# Patient Record
Sex: Female | Born: 1985 | Race: White | Hispanic: No | Marital: Married | Smoking: Never smoker
Health system: Southern US, Community
[De-identification: ages and names within clinical notes are randomized; demographics above are authoritative.]

## PROBLEM LIST (undated history)

## (undated) DIAGNOSIS — F32A Depression, unspecified: Secondary | ICD-10-CM

## (undated) DIAGNOSIS — O039 Complete or unspecified spontaneous abortion without complication: Secondary | ICD-10-CM

## (undated) DIAGNOSIS — E282 Polycystic ovarian syndrome: Secondary | ICD-10-CM

## (undated) DIAGNOSIS — F419 Anxiety disorder, unspecified: Secondary | ICD-10-CM

## (undated) DIAGNOSIS — G43909 Migraine, unspecified, not intractable, without status migrainosus: Secondary | ICD-10-CM

## (undated) DIAGNOSIS — Z8742 Personal history of other diseases of the female genital tract: Secondary | ICD-10-CM

## (undated) DIAGNOSIS — D649 Anemia, unspecified: Secondary | ICD-10-CM

## (undated) DIAGNOSIS — Z6841 Body Mass Index (BMI) 40.0 and over, adult: Secondary | ICD-10-CM

## (undated) HISTORY — DX: Migraine, unspecified, not intractable, without status migrainosus: G43.909

## (undated) HISTORY — PX: OTHER SURGICAL HISTORY: SHX169

## (undated) HISTORY — DX: Anxiety disorder, unspecified: F41.9

## (undated) HISTORY — DX: Complete or unspecified spontaneous abortion without complication: O03.9

## (undated) HISTORY — DX: Anemia, unspecified: D64.9

## (undated) HISTORY — DX: Polycystic ovarian syndrome: E28.2

## (undated) HISTORY — DX: Depression, unspecified: F32.A

## (undated) HISTORY — PX: WISDOM TOOTH EXTRACTION: SHX21

## (undated) HISTORY — DX: Body Mass Index (BMI) 40.0 and over, adult: Z684

---

## 2011-03-06 ENCOUNTER — Ambulatory Visit (INDEPENDENT_AMBULATORY_CARE_PROVIDER_SITE_OTHER): Payer: BC Managed Care – PPO

## 2011-03-06 DIAGNOSIS — J019 Acute sinusitis, unspecified: Secondary | ICD-10-CM

## 2011-03-06 DIAGNOSIS — J309 Allergic rhinitis, unspecified: Secondary | ICD-10-CM

## 2014-08-04 DIAGNOSIS — E282 Polycystic ovarian syndrome: Secondary | ICD-10-CM | POA: Insufficient documentation

## 2016-04-18 LAB — OB RESULTS CONSOLE ABO/RH: RH TYPE: NEGATIVE

## 2017-02-27 LAB — OB RESULTS CONSOLE RPR: RPR: NONREACTIVE

## 2017-02-27 LAB — OB RESULTS CONSOLE ANTIBODY SCREEN: ANTIBODY SCREEN: NEGATIVE

## 2017-02-27 LAB — OB RESULTS CONSOLE HIV ANTIBODY (ROUTINE TESTING): HIV: NONREACTIVE

## 2017-04-23 LAB — OB RESULTS CONSOLE GBS: GBS: POSITIVE

## 2017-05-10 ENCOUNTER — Other Ambulatory Visit: Payer: Self-pay | Admitting: Obstetrics and Gynecology

## 2017-05-10 ENCOUNTER — Encounter (HOSPITAL_COMMUNITY): Payer: Self-pay

## 2017-05-10 NOTE — Patient Instructions (Signed)
Mary Bradford  05/10/2017   Your procedure is scheduled on:  05/12/2017  [facility] at 0945 AM.  Pick up the phone at the desk and dial [phone]  Call this number if you have problems the morning of surgery:[phone]  Remember:   Do not eat food:(After Midnight) Desps de medianoche.  Do not drink clear liquids: (After Midnight) Desps de medianoche.  Take these medicines the morning of surgery with A SIP OF WATER: none   Do not wear jewelry, make-up or nail polish.  Do not wear lotions, powders, or perfumes. Do not wear deodorant.  Do not shave 48 hours prior to surgery.  Do not bring valuables to the hospital.  [facility] is not   responsible for any belongings or valuables brought to the hospital.  Contacts, dentures or bridgework may not be worn into surgery.  Leave suitcase in the car. After surgery it may be brought to your room.  For patients admitted to the hospital, checkout time is 11:00 AM the day of              discharge.    N/A   Please read over the following fact sheets that you were given:   Surgical Site Infection Prevention

## 2017-05-11 ENCOUNTER — Encounter (HOSPITAL_COMMUNITY)
Admission: RE | Admit: 2017-05-11 | Discharge: 2017-05-11 | Disposition: A | Discharge status: Home / Self Care | Payer: BLUE CROSS/BLUE SHIELD | Source: Ambulatory Visit | Attending: Obstetrics and Gynecology | Admitting: Obstetrics and Gynecology

## 2017-05-11 DIAGNOSIS — O321XX0 Maternal care for breech presentation, not applicable or unspecified: Principal | ICD-10-CM | POA: Diagnosis present

## 2017-05-11 DIAGNOSIS — Z6791 Unspecified blood type, Rh negative: Secondary | ICD-10-CM

## 2017-05-11 DIAGNOSIS — O99824 Streptococcus B carrier state complicating childbirth: Secondary | ICD-10-CM | POA: Diagnosis present

## 2017-05-11 DIAGNOSIS — Z3A39 39 weeks gestation of pregnancy: Secondary | ICD-10-CM

## 2017-05-11 HISTORY — DX: Personal history of other diseases of the female genital tract: Z87.42

## 2017-05-11 LAB — CBC
HCT: 34.1 % — ABNORMAL LOW (ref 36.0–46.0)
Hemoglobin: 11.4 g/dL — ABNORMAL LOW (ref 12.0–15.0)
MCH: 27.7 pg (ref 26.0–34.0)
MCHC: 33.4 g/dL (ref 30.0–36.0)
MCV: 82.8 fL (ref 78.0–100.0)
Platelets: 266 10*3/uL (ref 150–400)
RBC: 4.12 MIL/uL (ref 3.87–5.11)
RDW: 14.7 % (ref 11.5–15.5)
WBC: 12.1 10*3/uL — AB (ref 4.0–10.5)

## 2017-05-12 ENCOUNTER — Encounter (HOSPITAL_COMMUNITY): Payer: Self-pay | Admitting: Anesthesiology

## 2017-05-12 ENCOUNTER — Inpatient Hospital Stay (HOSPITAL_COMMUNITY)
Admission: RE | Admit: 2017-05-12 | Discharge: 2017-05-14 | DRG: 788 | Disposition: A | Discharge status: Home / Self Care | Payer: BLUE CROSS/BLUE SHIELD | Source: Ambulatory Visit | Attending: Obstetrics and Gynecology | Admitting: Obstetrics and Gynecology

## 2017-05-12 ENCOUNTER — Inpatient Hospital Stay (HOSPITAL_COMMUNITY): Payer: BLUE CROSS/BLUE SHIELD | Admitting: Anesthesiology

## 2017-05-12 ENCOUNTER — Other Ambulatory Visit: Payer: Self-pay

## 2017-05-12 ENCOUNTER — Encounter (HOSPITAL_COMMUNITY)
Admission: RE | Disposition: A | Discharge status: Home / Self Care | Payer: Self-pay | Source: Ambulatory Visit | Attending: Obstetrics and Gynecology

## 2017-05-12 DIAGNOSIS — O321XX0 Maternal care for breech presentation, not applicable or unspecified: Secondary | ICD-10-CM | POA: Diagnosis present

## 2017-05-12 DIAGNOSIS — Z3A39 39 weeks gestation of pregnancy: Secondary | ICD-10-CM | POA: Diagnosis not present

## 2017-05-12 DIAGNOSIS — Z98891 History of uterine scar from previous surgery: Secondary | ICD-10-CM

## 2017-05-12 DIAGNOSIS — O99824 Streptococcus B carrier state complicating childbirth: Secondary | ICD-10-CM | POA: Diagnosis present

## 2017-05-12 DIAGNOSIS — Z6791 Unspecified blood type, Rh negative: Secondary | ICD-10-CM | POA: Diagnosis not present

## 2017-05-12 LAB — HEPATITIS B SURFACE ANTIGEN: HEP B S AG: NEGATIVE

## 2017-05-12 LAB — RPR: RPR Ser Ql: NONREACTIVE

## 2017-05-12 LAB — TYPE AND SCREEN
ABO/RH(D): B NEG
Antibody Screen: POSITIVE

## 2017-05-12 SURGERY — Surgical Case
Anesthesia: Spinal | Site: Abdomen | Wound class: Clean Contaminated

## 2017-05-12 MED ORDER — DIBUCAINE 1 % RE OINT
1.0000 "application " | TOPICAL_OINTMENT | RECTAL | Status: DC | PRN
  Filled 2017-05-12: qty 28

## 2017-05-12 MED ORDER — FENTANYL CITRATE (PF) 100 MCG/2ML IJ SOLN
INTRAMUSCULAR | Status: AC
  Filled 2017-05-12: qty 2

## 2017-05-12 MED ORDER — OXYTOCIN 10 UNIT/ML IJ SOLN
INTRAVENOUS | Status: DC | PRN
  Administered 2017-05-12: 40 [IU] via INTRAVENOUS

## 2017-05-12 MED ORDER — PHENYLEPHRINE 8 MG IN D5W 100 ML (0.08MG/ML) PREMIX OPTIME
INJECTION | INTRAVENOUS | Status: DC | PRN
  Administered 2017-05-12: 30 ug/min via INTRAVENOUS

## 2017-05-12 MED ORDER — DIPHENHYDRAMINE HCL 25 MG PO CAPS
25.0000 mg | ORAL_CAPSULE | Freq: Four times a day (QID) | ORAL | Status: DC | PRN
  Filled 2017-05-12: qty 1

## 2017-05-12 MED ORDER — MORPHINE SULFATE (PF) 0.5 MG/ML IJ SOLN
INTRAMUSCULAR | Status: DC | PRN
  Administered 2017-05-12: .2 mg via INTRATHECAL

## 2017-05-12 MED ORDER — LACTATED RINGERS IV SOLN
INTRAVENOUS | Status: DC
  Administered 2017-05-12 (×2): via INTRAVENOUS

## 2017-05-12 MED ORDER — OXYCODONE HCL 5 MG PO TABS: 10.0000 mg | ORAL_TABLET | ORAL | Status: DC | PRN

## 2017-05-12 MED ORDER — PRENATAL MULTIVITAMIN CH
1.0000 | ORAL_TABLET | Freq: Every day | ORAL | Status: DC
  Administered 2017-05-13 – 2017-05-14 (×2): 1 via ORAL
  Filled 2017-05-12 (×4): qty 1

## 2017-05-12 MED ORDER — FENTANYL CITRATE (PF) 100 MCG/2ML IJ SOLN
INTRAMUSCULAR | Status: DC | PRN
  Administered 2017-05-12: 20 ug via INTRATHECAL

## 2017-05-12 MED ORDER — NALOXONE HCL 4 MG/10ML IJ SOLN
1.0000 ug/kg/h | INTRAVENOUS | Status: DC | PRN
  Filled 2017-05-12: qty 5

## 2017-05-12 MED ORDER — SCOPOLAMINE 1 MG/3DAYS TD PT72
MEDICATED_PATCH | TRANSDERMAL | Status: AC
  Filled 2017-05-12: qty 1

## 2017-05-12 MED ORDER — NALBUPHINE HCL 10 MG/ML IJ SOLN: 5.0000 mg | Freq: Once | INTRAMUSCULAR | Status: AC | PRN

## 2017-05-12 MED ORDER — NALBUPHINE HCL 10 MG/ML IJ SOLN: 5.0000 mg | INTRAMUSCULAR | Status: DC | PRN

## 2017-05-12 MED ORDER — GENTAMICIN SULFATE 40 MG/ML IJ SOLN
INTRAVENOUS | Status: DC
  Filled 2017-05-12 (×2): qty 11

## 2017-05-12 MED ORDER — SIMETHICONE 80 MG PO CHEW
80.0000 mg | CHEWABLE_TABLET | Freq: Three times a day (TID) | ORAL | Status: DC
  Administered 2017-05-12 – 2017-05-14 (×4): 80 mg via ORAL
  Filled 2017-05-12 (×9): qty 1

## 2017-05-12 MED ORDER — ONDANSETRON HCL 4 MG/2ML IJ SOLN
INTRAMUSCULAR | Status: AC
  Filled 2017-05-12: qty 2

## 2017-05-12 MED ORDER — DIPHENHYDRAMINE HCL 50 MG/ML IJ SOLN: 12.5000 mg | INTRAMUSCULAR | Status: DC | PRN

## 2017-05-12 MED ORDER — ONDANSETRON HCL 4 MG/2ML IJ SOLN
INTRAMUSCULAR | Status: DC | PRN
  Administered 2017-05-12: 4 mg via INTRAVENOUS

## 2017-05-12 MED ORDER — DEXAMETHASONE SODIUM PHOSPHATE 10 MG/ML IJ SOLN
INTRAMUSCULAR | Status: AC
  Filled 2017-05-12: qty 1

## 2017-05-12 MED ORDER — COCONUT OIL OIL
1.0000 "application " | TOPICAL_OIL | Status: DC | PRN
  Filled 2017-05-12: qty 120

## 2017-05-12 MED ORDER — MENTHOL 3 MG MT LOZG
1.0000 | LOZENGE | OROMUCOSAL | Status: DC | PRN
  Filled 2017-05-12: qty 9

## 2017-05-12 MED ORDER — KETOROLAC TROMETHAMINE 30 MG/ML IJ SOLN: 30.0000 mg | Freq: Four times a day (QID) | INTRAMUSCULAR | Status: AC | PRN

## 2017-05-12 MED ORDER — NALBUPHINE HCL 10 MG/ML IJ SOLN
INTRAMUSCULAR | Status: AC
  Filled 2017-05-12: qty 1

## 2017-05-12 MED ORDER — SODIUM CHLORIDE 0.9 % IR SOLN
Status: DC | PRN
  Administered 2017-05-12: 1000 mL

## 2017-05-12 MED ORDER — NALOXONE HCL 0.4 MG/ML IJ SOLN: 0.4000 mg | INTRAMUSCULAR | Status: DC | PRN

## 2017-05-12 MED ORDER — MEPERIDINE HCL 25 MG/ML IJ SOLN: 6.2500 mg | INTRAMUSCULAR | Status: DC | PRN

## 2017-05-12 MED ORDER — SIMETHICONE 80 MG PO CHEW
80.0000 mg | CHEWABLE_TABLET | ORAL | Status: DC | PRN
Start: 2017-05-12 — End: 2017-05-14
  Filled 2017-05-12: qty 1

## 2017-05-12 MED ORDER — KETOROLAC TROMETHAMINE 30 MG/ML IJ SOLN
INTRAMUSCULAR | Status: AC
  Administered 2017-05-12: 30 mg via INTRAMUSCULAR
  Filled 2017-05-12: qty 1

## 2017-05-12 MED ORDER — MORPHINE SULFATE (PF) 0.5 MG/ML IJ SOLN
INTRAMUSCULAR | Status: AC
  Filled 2017-05-12: qty 10

## 2017-05-12 MED ORDER — SENNOSIDES-DOCUSATE SODIUM 8.6-50 MG PO TABS
2.0000 | ORAL_TABLET | ORAL | Status: DC
  Administered 2017-05-12 – 2017-05-13 (×2): 2 via ORAL
  Filled 2017-05-12 (×4): qty 2

## 2017-05-12 MED ORDER — OXYTOCIN 10 UNIT/ML IJ SOLN
INTRAMUSCULAR | Status: AC
  Filled 2017-05-12: qty 4

## 2017-05-12 MED ORDER — LACTATED RINGERS IV SOLN
INTRAVENOUS | Status: DC
  Administered 2017-05-12: 21:00:00 via INTRAVENOUS

## 2017-05-12 MED ORDER — OXYCODONE HCL 5 MG PO TABS
5.0000 mg | ORAL_TABLET | ORAL | Status: DC | PRN
  Administered 2017-05-13: 5 mg via ORAL
  Filled 2017-05-12: qty 1

## 2017-05-12 MED ORDER — FENTANYL CITRATE (PF) 100 MCG/2ML IJ SOLN: 25.0000 ug | INTRAMUSCULAR | Status: DC | PRN

## 2017-05-12 MED ORDER — LACTATED RINGERS IV SOLN
INTRAVENOUS | Status: DC
  Administered 2017-05-12: 12:00:00 via INTRAVENOUS

## 2017-05-12 MED ORDER — DEXTROSE 5 % IV SOLN
INTRAVENOUS | Status: DC | PRN
  Administered 2017-05-12: 100 mL via INTRAVENOUS

## 2017-05-12 MED ORDER — BUPIVACAINE IN DEXTROSE 0.75-8.25 % IT SOLN
INTRATHECAL | Status: DC | PRN
  Administered 2017-05-12: 1.5 mL via INTRATHECAL

## 2017-05-12 MED ORDER — WITCH HAZEL-GLYCERIN EX PADS: 1.0000 "application " | MEDICATED_PAD | CUTANEOUS | Status: DC | PRN

## 2017-05-12 MED ORDER — ACETAMINOPHEN 325 MG PO TABS
650.0000 mg | ORAL_TABLET | ORAL | Status: DC | PRN
  Administered 2017-05-13 (×2): 650 mg via ORAL
  Filled 2017-05-12 (×2): qty 2

## 2017-05-12 MED ORDER — METOCLOPRAMIDE HCL 5 MG/ML IJ SOLN: 10.0000 mg | Freq: Once | INTRAMUSCULAR | Status: DC | PRN

## 2017-05-12 MED ORDER — DEXAMETHASONE SODIUM PHOSPHATE 10 MG/ML IJ SOLN
INTRAMUSCULAR | Status: DC | PRN
  Administered 2017-05-12: 10 mg via INTRAVENOUS

## 2017-05-12 MED ORDER — SODIUM CHLORIDE 0.9% FLUSH: 3.0000 mL | INTRAVENOUS | Status: DC | PRN

## 2017-05-12 MED ORDER — DIPHENHYDRAMINE HCL 25 MG PO CAPS
25.0000 mg | ORAL_CAPSULE | ORAL | Status: DC | PRN
  Filled 2017-05-12: qty 1

## 2017-05-12 MED ORDER — IBUPROFEN 600 MG PO TABS
600.0000 mg | ORAL_TABLET | Freq: Four times a day (QID) | ORAL | Status: DC
  Administered 2017-05-12 – 2017-05-14 (×7): 600 mg via ORAL
  Filled 2017-05-12 (×7): qty 1

## 2017-05-12 MED ORDER — SOD CITRATE-CITRIC ACID 500-334 MG/5ML PO SOLN
30.0000 mL | Freq: Once | ORAL | Status: AC
  Administered 2017-05-12: 30 mL via ORAL
  Filled 2017-05-12: qty 15

## 2017-05-12 MED ORDER — ONDANSETRON HCL 4 MG/2ML IJ SOLN: 4.0000 mg | Freq: Three times a day (TID) | INTRAMUSCULAR | Status: DC | PRN

## 2017-05-12 MED ORDER — TETANUS-DIPHTH-ACELL PERTUSSIS 5-2.5-18.5 LF-MCG/0.5 IM SUSP
0.5000 mL | Freq: Once | INTRAMUSCULAR | Status: DC
  Filled 2017-05-12: qty 0.5

## 2017-05-12 MED ORDER — SCOPOLAMINE 1 MG/3DAYS TD PT72
1.0000 | MEDICATED_PATCH | Freq: Once | TRANSDERMAL | Status: AC
  Administered 2017-05-12: 1.5 mg via TRANSDERMAL
  Administered 2017-05-12: 1 via TRANSDERMAL
  Filled 2017-05-12: qty 1

## 2017-05-12 MED ORDER — OXYTOCIN 40 UNITS IN LACTATED RINGERS INFUSION - SIMPLE MED: 2.5000 [IU]/h | INTRAVENOUS | Status: AC

## 2017-05-12 MED ORDER — ZOLPIDEM TARTRATE 5 MG PO TABS: 5.0000 mg | ORAL_TABLET | Freq: Every evening | ORAL | Status: DC | PRN

## 2017-05-12 MED ORDER — SIMETHICONE 80 MG PO CHEW
80.0000 mg | CHEWABLE_TABLET | ORAL | Status: DC
  Administered 2017-05-12 – 2017-05-13 (×2): 80 mg via ORAL
  Filled 2017-05-12 (×4): qty 1

## 2017-05-12 MED ORDER — LACTATED RINGERS IV SOLN
INTRAVENOUS | Status: DC | PRN
  Administered 2017-05-12: 13:00:00 via INTRAVENOUS

## 2017-05-12 MED ORDER — NALBUPHINE HCL 10 MG/ML IJ SOLN
5.0000 mg | Freq: Once | INTRAMUSCULAR | Status: AC | PRN
  Administered 2017-05-12: 5 mg via SUBCUTANEOUS

## 2017-05-12 SURGICAL SUPPLY — 34 items
BENZOIN TINCTURE PRP APPL 2/3 (GAUZE/BANDAGES/DRESSINGS) ×2 IMPLANT
CHLORAPREP W/TINT 26ML (MISCELLANEOUS) ×2 IMPLANT
CLAMP CORD UMBIL (MISCELLANEOUS) IMPLANT
CLOTH BEACON ORANGE TIMEOUT ST (SAFETY) ×2 IMPLANT
DRSG OPSITE POSTOP 4X10 (GAUZE/BANDAGES/DRESSINGS) ×2 IMPLANT
ELECT REM PT RETURN 9FT ADLT (ELECTROSURGICAL) ×2
ELECTRODE REM PT RTRN 9FT ADLT (ELECTROSURGICAL) ×1 IMPLANT
EXTRACTOR VACUUM M CUP 4 TUBE (SUCTIONS) IMPLANT
GLOVE BIO SURGEON STRL SZ7.5 (GLOVE) ×2 IMPLANT
GLOVE BIOGEL PI IND STRL 7.0 (GLOVE) ×1 IMPLANT
GLOVE BIOGEL PI IND STRL 7.5 (GLOVE) ×1 IMPLANT
GLOVE BIOGEL PI INDICATOR 7.0 (GLOVE) ×1
GLOVE BIOGEL PI INDICATOR 7.5 (GLOVE) ×1
GOWN STRL REUS W/TWL LRG LVL3 (GOWN DISPOSABLE) ×4 IMPLANT
HEMOSTAT SURGICEL 2X14 (HEMOSTASIS) ×2 IMPLANT
KIT ABG SYR 3ML LUER SLIP (SYRINGE) IMPLANT
NEEDLE HYPO 25X5/8 SAFETYGLIDE (NEEDLE) IMPLANT
NS IRRIG 1000ML POUR BTL (IV SOLUTION) ×2 IMPLANT
PACK C SECTION WH (CUSTOM PROCEDURE TRAY) ×2 IMPLANT
PAD OB MATERNITY 4.3X12.25 (PERSONAL CARE ITEMS) ×2 IMPLANT
PENCIL BUTTON HOLSTER BLD 10FT (ELECTRODE) ×2 IMPLANT
PENCIL SMOKE EVAC W/HOLSTER (ELECTROSURGICAL) ×2 IMPLANT
RTRCTR C-SECT PINK 25CM LRG (MISCELLANEOUS) ×2 IMPLANT
STRIP CLOSURE SKIN 1/2X4 (GAUZE/BANDAGES/DRESSINGS) ×2 IMPLANT
SUT CHROMIC 2 0 CT 1 (SUTURE) ×2 IMPLANT
SUT MNCRL AB 3-0 PS2 27 (SUTURE) ×2 IMPLANT
SUT PLAIN 2 0 XLH (SUTURE) ×2 IMPLANT
SUT VIC AB 0 CT1 36 (SUTURE) ×2 IMPLANT
SUT VIC AB 0 CTX 36 (SUTURE) ×3
SUT VIC AB 0 CTX36XBRD ANBCTRL (SUTURE) ×3 IMPLANT
SUT VIC AB 2-0 SH 27 (SUTURE) ×2
SUT VIC AB 2-0 SH 27XBRD (SUTURE) ×2 IMPLANT
TOWEL OR 17X24 6PK STRL BLUE (TOWEL DISPOSABLE) ×2 IMPLANT
TRAY FOLEY BAG SILVER LF 14FR (SET/KITS/TRAYS/PACK) ×2 IMPLANT

## 2017-05-12 NOTE — Lactation Note (Signed)
This note was copied from a baby's chart. Lactation Consultation Note  Patient Name: Mary Bradford WUJWJ'XToday's Date: 05/12/2017 Reason for consult: Initial assessment;Primapara;1st time breastfeeding;Term  7 hours old female who is being exclusively BF by his mother, she's a P1. Nurse called because she was concerned about baby latching on and transferring. Mom has short shaft nipples and cone shaped breasts, but she stated that she noticed changes in her breast during the pregnancy, the got larger and the areola darker. Taught mom how to hand express, but only got traces of colostrum, less than a drop.   Baby had difficulty latching on; did some suck training and noticed baby's tongue has some degree of restriction. Tried a # 20 NS and baby was able to grasp the breast easily after a few attempts, but no swallows were heard. Baby was still feeding when exiting the room, asked parents to watch for colostrum on NS once baby is done.   RN will set up a DEBP for mom and she'll start pumping after feedings at least 8 times/day. Encouraged mom to keep putting baby at the breast STS on feeding cues 8-12 times/day and use NS if baby has difficulty latching. Reviewed BF brochure, BF resources and feeding diary, mom is aware of LC services and will call PRN.  Maternal Data Formula Feeding for Exclusion: No Has patient been taught Hand Expression?: Yes Does the patient have breastfeeding experience prior to this delivery?: No  Feeding Feeding Type: Breast Fed  LATCH Score Latch: Repeated attempts needed to sustain latch, nipple held in mouth throughout feeding, stimulation needed to elicit sucking reflex.  Audible Swallowing: None  Type of Nipple: Everted at rest and after stimulation  Comfort (Breast/Nipple): Soft / non-tender  Hold (Positioning): Assistance needed to correctly position infant at breast and maintain latch.  LATCH Score: 6  Interventions Interventions: Breast feeding  basics reviewed;Assisted with latch;Skin to skin;Breast massage;Breast compression;Hand express;Adjust position;Support pillows;Position options  Lactation Tools Discussed/Used Tools: Nipple Shields Nipple shield size: 20 WIC Program: No   Consult Status Consult Status: Follow-up Date: 05/13/17 Follow-up type: In-patient    Akio Hudnall Venetia ConstableS Markelle Asaro 05/12/2017, 8:58 PM

## 2017-05-12 NOTE — Anesthesia Preprocedure Evaluation (Addendum)
Anesthesia Evaluation  Patient identified by MRN, date of birth, ID band Patient awake    Reviewed: Allergy & Precautions, NPO status , Patient's Chart, lab work & pertinent test results  Airway Mallampati: III  TM Distance: >3 FB Neck ROM: Full    Dental no notable dental hx. 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- 2+ years ago, asymptomatic currently   Abdominal (+) + obese,   Peds  Hematology  (+) anemia ,   Anesthesia Other Findings   Reproductive/Obstetrics Transverse lie IVF pregnancy                           Anesthesia Physical Anesthesia Plan  ASA: III  Anesthesia Plan: Spinal   Post-op Pain Management:    Induction:   PONV Risk Score and Plan:   Airway Management Planned: Natural Airway  Additional Equipment:   Intra-op Plan:   Post-operative Plan:   Informed Consent: I have reviewed the patients History and Physical, chart, labs and discussed the procedure including the risks, benefits and alternatives for the proposed anesthesia with the patient or authorized representative who has indicated his/her understanding and acceptance.   Dental advisory given  Plan Discussed with: CRNA, Anesthesiologist and Surgeon  Anesthesia Plan Comments:         Anesthesia Quick Evaluation

## 2017-05-12 NOTE — Transfer of Care (Signed)
Immediate Anesthesia Transfer of Care Note  Patient: Mary Bradford  Procedure(s) Performed: CESAREAN SECTION (N/A Abdomen)  Patient Location: PACU  Anesthesia Type:Spinal  Level of Consciousness: awake, alert  and oriented  Airway & Oxygen Therapy: Patient Spontanous Breathing  Post-op Assessment: Report given to RN and Post -op Vital signs reviewed and stable  Post vital signs: Reviewed and stable  Last Vitals:  Vitals:   05/12/17 1103  Pulse: 100  Temp: 36.7 C    Last Pain:  Vitals:   05/12/17 1103  TempSrc: Oral         Complications: No apparent anesthesia complications

## 2017-05-12 NOTE — Op Note (Signed)
Cesarean Section Procedure Note  Indications: P0 at 39wks admitted for scheduled primary c-section for malpresentation.  Pre-operative Diagnosis: 1.39wks 2.breech presenation/malpresentation   Post-operative Diagnosis: 1.39wks 2.breech presenation/malpresentation  Procedure: PRIMARY LOW TRANSVERSE CESAREAN SECTION  Surgeon: Osborn Cohooberts, Jontae Sonier, MD    Assistants: Genice Rougeracey Tucker, RNFA  Anesthesia: Spinal  Anesthesiologist: Mal AmabileFoster, Michael, MD   Procedure Details  The patient was taken to the operating room secondary to malpresentation after the risks, benefits, complications, treatment options, and expected outcomes were discussed with the patient.  The patient concurred with the proposed plan, giving informed consent which was signed and witnessed. The patient was taken to Operating Room One, identified as Mary Bradford and the procedure verified as C-Section Delivery. A Time Out was held and the above information confirmed.  After induction of anesthesia by obtaining a spinal, the patient was prepped and draped in the usual sterile manner. A Pfannenstiel skin incision was made and carried down through the subcutaneous tissue to the underlying layer of fascia.  The fascia was incised bilaterally and extended transversely bilaterally with the Mayo scissors. Kocher clamps were placed on the inferior aspect of the fascial incision and the underlying rectus muscle was separated from the fascia. The same was done on the superior aspect of the fascial incision.  The peritoneum was identified, entered bluntly and extended manually.  An Alexis self-retaining retractor was placed.  The utero-vesical peritoneal reflection was incised transversely and the bladder flap was bluntly freed from the lower uterine segment. A low transverse uterine incision was made with the scalpel and extended bilaterally with the bandage scissors.  The infant was delivered in vertex position without difficulty.  After the  umbilical cord was clamped and cut, the infant was handed to the awaiting pediatricians.  Cord blood was obtained for evaluation.  The placenta was removed intact and appeared to be within normal limits. The uterus was cleared of all clots and debris. The uterine incision was closed with running interlocking sutures of 0 Vicryl and a second imbricating layer was performed as well.   Bilateral tubes and ovaries appeared to be within normal limits.  One cm paratubal clear cyst removed with bovie. Good hemostasis was noted.  Copious irrigation was performed until clear.  The peritoneum was repaired with 2-0 chromic via a running suture.  The fascia was reapproximated with a running suture of 0 Vicryl. The subcutaneous tissue was reapproximated with 3 interrupted sutures of 2-0 plain.  The skin was reapproximated with a subcuticular suture of 3-0 monocryl.  Steristrips were applied.  Instrument, sponge, and needle counts were correct prior to abdominal closure and at the conclusion of the case.  The patient was awaiting transfer to the recovery room in good condition.  Findings: Live female infant with Apgars 8 at one minute and 9 at five minutes.  Normal appearing bilateral ovaries and fallopian tubes were noted.  Estimated Blood Loss:  400 ml         Drains: foley to gravity 30 cc         Total IV Fluids: 2000 ml         Specimens to Pathology: none         Complications:  None; patient tolerated the procedure well.         Disposition: PACU - hemodynamically stable.         Condition: stable  Attending Attestation: I performed the procedure.

## 2017-05-12 NOTE — Anesthesia Procedure Notes (Signed)
Spinal  Patient location during procedure: OR Start time: 05/12/2017 12:51 PM Staffing Anesthesiologist: Mal AmabileFoster, Adar Rase, MD Performed: anesthesiologist  Preanesthetic Checklist Completed: patient identified, site marked, surgical consent, pre-op evaluation, timeout performed, IV checked, risks and benefits discussed and monitors and equipment checked Spinal Block Patient position: sitting Prep: site prepped and draped and DuraPrep Patient monitoring: heart rate, cardiac monitor, continuous pulse ox and blood pressure Approach: midline Location: L3-4 Injection technique: single-shot Needle Needle type: Pencan  Needle gauge: 24 G Needle length: 9 cm Needle insertion depth: 7 cm Assessment Sensory level: T4 Additional Notes Patient tolerated procedure well. Adequate sensory level.

## 2017-05-12 NOTE — Anesthesia Postprocedure Evaluation (Signed)
Anesthesia Post Note  Patient: Mary Bradford  Procedure(s) Performed: CESAREAN SECTION (N/A Abdomen)     Patient location during evaluation: PACU Anesthesia Type: Spinal Level of consciousness: oriented and awake and alert Pain management: pain level controlled Vital Signs Assessment: post-procedure vital signs reviewed and stable Respiratory status: spontaneous breathing, respiratory function stable and nonlabored ventilation Cardiovascular status: blood pressure returned to baseline and stable Postop Assessment: no headache, no backache, no apparent nausea or vomiting, spinal receding and patient able to bend at knees Anesthetic complications: no    Last Vitals:  Vitals:   05/12/17 1515 05/12/17 1530  BP:  106/69  Pulse: 79 65  Resp: (!) 23   Temp:  37.3 C  SpO2: 98%     Last Pain:  Vitals:   05/12/17 1530  TempSrc: Oral   Pain Goal:                 Quianna Avery A.

## 2017-05-12 NOTE — Interval H&P Note (Signed)
History and Physical Interval Note:  05/12/2017 12:17 PM  Mary Bradford  has presented today for surgery, with the diagnosis of Transverse Lie  The various methods of treatment have been discussed with the patient and family. After consideration of risks, benefits and other options for treatment, the patient has consented to  Procedure(s) with comments: CESAREAN SECTION, ULTRASOUND BEFORE CESAREAN TO CONFIRM PRESENTATION (N/A) - RNFA Requested ULTRASOUND BEFORE CESAREAN TO CONFIRM PRESENTATION as a surgical intervention .  The patient's history has been reviewed, patient examined, no change in status, stable for surgery.  I have reviewed the patient's chart and labs.  Questions were answered to the patient's satisfaction.     Purcell NailsAngela Y Marylin Lathon

## 2017-05-12 NOTE — H&P (Signed)
Mary Bradford is a 32 y.o. female presenting for c-section due to malpresentation.  Pt denies lof, vb, labor ctxs and reports +FM. 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  OB History    Gravida Para Term Preterm AB Living   2       1     SAB TAB Ectopic Multiple Live Births   1             Past Medical History:  Diagnosis Date  . History of PCOS   . Newborn product of in vitro fertilization (IVF) pregnancy    Past Surgical History:  Procedure Laterality Date  . egg  retrieval    . WISDOM TOOTH EXTRACTION     Family History: family history includes Heart disease in her maternal grandmother; Hypertension in her father and mother; Irritable bowel syndrome in her sister; Kidney Stones in her father and mother; Kidney disease in her sister; Leukemia in her maternal uncle and paternal grandfather; Migraines in her father and sister; Non-Hodgkin's lymphoma in her paternal grandfather. Social History:  reports that  has never smoked. she has never used smokeless tobacco. She reports that she does not drink alcohol or use drugs.     Maternal Diabetes: No Genetic Screening: Declined Maternal Ultrasounds/Referrals: Normal Fetal Ultrasounds or other Referrals:  None Maternal Substance Abuse:  No Significant Maternal Medications:  None Significant Maternal Lab Results:  Lab values include: Group B Strep positive, Rh negative Other Comments:  None  ROS  Non-contributory  History   Pulse 100, temperature 98.1 F (36.7 C), temperature source Oral. Exam Physical Exam  Lungs CTA CV RRR Abd gravid, NT Ext no calf tenderness, NT +FHT  Prenatal labs: ABO, Rh: --/--/B NEG (02/15 1048) Antibody: POS (02/15 1048) Rubella:  pending RPR: Non Reactive (02/15 1048)  HBsAg:  pending HIV: Non-reactive (12/04 0000)  GBS: Positive (01/28 0000)   Assessment/Plan: P0 at 39 wks being admitted for c-section due to  malpresentation.  Bedside u/s to confirm malpresentation.  R/B/A discussed including but not limited to B/I/I and consent s/w.   Purcell Nailsngela Y Rudine Rieger 05/12/2017, 11:15 AM

## 2017-05-13 LAB — CBC
HCT: 26.7 % — ABNORMAL LOW (ref 36.0–46.0)
Hemoglobin: 9.1 g/dL — ABNORMAL LOW (ref 12.0–15.0)
MCH: 28.1 pg (ref 26.0–34.0)
MCHC: 34.1 g/dL (ref 30.0–36.0)
MCV: 82.4 fL (ref 78.0–100.0)
PLATELETS: 243 10*3/uL (ref 150–400)
RBC: 3.24 MIL/uL — AB (ref 3.87–5.11)
RDW: 14.7 % (ref 11.5–15.5)
WBC: 17.1 10*3/uL — AB (ref 4.0–10.5)

## 2017-05-13 LAB — RUBELLA ANTIBODY, IGM: Rubella IgM: <20 AU/mL (ref 0.0–19.9)

## 2017-05-13 NOTE — Addendum Note (Signed)
Addendum  created 05/13/17 0755 by Rica Recordsickelton, Cagney Degrace, CRNA   Sign clinical note

## 2017-05-13 NOTE — Progress Notes (Signed)
Subjective: Postpartum Day 1: Cesarean Delivery breech Patient reports + flatus and no problems voiding.    Objective: Vital signs in last 24 hours: Temp:  [97.6 F (36.4 C)-99.1 F (37.3 C)] 98.2 F (36.8 C) (02/17 0235) Pulse Rate:  [65-106] 72 (02/17 0235) Resp:  [15-24] 18 (02/17 0235) BP: (102-137)/(47-120) 102/55 (02/17 0235) SpO2:  [81 %-100 %] 97 % (02/17 0400) Weight:  [127.6 kg (281 lb 6.4 oz)] 127.6 kg (281 lb 6.4 oz) (02/16 1145)  Physical Exam:  General: alert, cooperative and no distress Lochia: appropriate Uterine Fundus: firm Incision: honey comb intact, no drainage DVT Evaluation: No evidence of DVT seen on physical exam. Negative Homan's sign.  Recent Labs    05/11/17 1048  HGB 11.4*  HCT 34.1*    Assessment/Plan: Status post Cesarean section. Doing well postoperatively.  Continue current care.  Plan on discharge tomorrow.  Mary Bradford 05/13/2017, 5:45 AM

## 2017-05-13 NOTE — Anesthesia Postprocedure Evaluation (Signed)
Anesthesia Post Note  Patient: Mary Bradford  Procedure(s) Performed: CESAREAN SECTION (N/A Abdomen)     Patient location during evaluation: Mother Baby Anesthesia Type: Spinal Level of consciousness: oriented and awake and alert Pain management: pain level controlled Vital Signs Assessment: post-procedure vital signs reviewed and stable Respiratory status: spontaneous breathing, respiratory function stable and patient connected to nasal cannula oxygen Cardiovascular status: blood pressure returned to baseline and stable Postop Assessment: no headache, no backache, no apparent nausea or vomiting and patient able to bend at knees Anesthetic complications: no    Last Vitals:  Vitals:   05/13/17 0400 05/13/17 0639  BP:  (!) 92/52  Pulse:  73  Resp:  18  Temp:  36.8 C  SpO2: 97% 97%    Last Pain:  Vitals:   05/13/17 0315  TempSrc:   PainSc: 4    Pain Goal:                 Rica RecordsICKELTON,Terease Marcotte

## 2017-05-14 LAB — RUBELLA SCREEN: Rubella: 1.3 index (ref >0.99)

## 2017-05-14 MED ORDER — IBUPROFEN 600 MG PO TABS: 600.0000 mg | ORAL_TABLET | Freq: Four times a day (QID) | ORAL | 0 refills | Status: DC

## 2017-05-14 MED ORDER — OXYCODONE HCL 5 MG PO TABS: 5.0000 mg | ORAL_TABLET | ORAL | 0 refills | Status: DC | PRN

## 2017-05-14 NOTE — Discharge Summary (Signed)
    OB Discharge Summary     Patient Name: Mary Bradford DOB: [birth year] MRN: [id number]  Date of admission: 05/12/2017 Delivering MD: Osborn CohoOBERTS, ANGELA   Date of discharge: 05/14/2017  Admitting diagnosis: Transverse Lie Intrauterine pregnancy: 1541w0d     Secondary diagnosis:  Active Problems:   S/P primary low transverse C-section  Additional problems: None     Discharge diagnosis: Term Pregnancy Delivered                                                                                                Post partum procedures:None  Augmentation: None  Complications: None  Hospital course:  Sceduled C/S   32 y.o. yo G2P1011 at 4241w0d was admitted to the hospital 05/12/2017 for scheduled cesarean section with the following indication:Malpresentation.  Membrane Rupture Time/Date: 1:16 PM ,05/12/2017   Patient delivered a Viable infant.05/12/2017  Details of operation can be found in separate operative note.  Pateint had an uncomplicated postpartum course.  She is ambulating, tolerating a regular diet, passing flatus, and urinating well. Patient is discharged home in stable condition on  05/14/17         Physical exam  Vitals:   05/13/17 1116 05/13/17 1430 05/13/17 1834 05/14/17 0523  BP: 136/74 121/68 123/60 107/60  Pulse: 71 98 91 81  Resp: 18 16 18 18   Temp: 97.7 F (36.5 C) 98.6 F (37 C) 97.6 F (36.4 C) 98.2 F (36.8 C)  TempSrc: Oral Axillary Axillary Oral  SpO2:  98%    Weight:      Height:       General: alert, cooperative and no distress Lochia: appropriate Uterine Fundus: firm Incision: Dressing is clean, dry, and intact DVT Evaluation: No evidence of DVT seen on physical exam. Negative Homan's sign. Labs: Lab Results  Component Value Date   WBC 17.1 (H) 05/13/2017   HGB 9.1 (L) 05/13/2017   HCT 26.7 (L) 05/13/2017   MCV 82.4 05/13/2017   PLT 243 05/13/2017   No flowsheet data found.  Discharge instruction: per After Visit Summary and "Baby and Me  Booklet".  After visit meds:  PNV, Ibuprofen, percocet  Diet: routine diet  Activity: Advance as tolerated. Pelvic rest for 6 weeks.   Outpatient follow up:6 weeks Follow up Appt:No future appointments. Follow up Visit:No Follow-up on file.  Postpartum contraception: Undecided  Newborn Data: Live born female  Birth Weight: 8 lb 11.9 oz (3965 g) APGAR: 8, 9  Newborn Delivery   Birth date/time:  05/12/2017 13:18:00 Delivery type:  C-Section, Low Transverse C-section categorization:  Primary     Baby Feeding: Breast Disposition:home with mother   05/14/2017 Kenney HousemanNancy Jean Elijiah Mickley, CNM

## 2017-05-15 LAB — TYPE AND SCREEN
ABO/RH(D): B NEG
Antibody Screen: POSITIVE
UNIT DIVISION: 0
UNIT DIVISION: 0
UNIT DIVISION: 0
Unit division: 0

## 2017-05-15 LAB — BPAM RBC
BLOOD PRODUCT EXPIRATION DATE: 201903202359
BLOOD PRODUCT EXPIRATION DATE: 201903222359
Blood Product Expiration Date: 201903132359
Blood Product Expiration Date: 201903192359
UNIT TYPE AND RH: 1700
UNIT TYPE AND RH: 1700
UNIT TYPE AND RH: 9500
Unit Type and Rh: 9500

## 2019-03-02 ENCOUNTER — Encounter: Payer: Self-pay | Admitting: Nurse Practitioner

## 2019-03-07 ENCOUNTER — Encounter: Payer: Self-pay | Admitting: Nurse Practitioner

## 2019-03-07 ENCOUNTER — Other Ambulatory Visit: Payer: Self-pay

## 2019-03-07 ENCOUNTER — Ambulatory Visit (INDEPENDENT_AMBULATORY_CARE_PROVIDER_SITE_OTHER): Payer: Self-pay | Admitting: Nurse Practitioner

## 2019-03-07 DIAGNOSIS — G43009 Migraine without aura, not intractable, without status migrainosus: Secondary | ICD-10-CM

## 2019-03-07 DIAGNOSIS — E669 Obesity, unspecified: Secondary | ICD-10-CM | POA: Insufficient documentation

## 2019-03-07 DIAGNOSIS — Z6841 Body Mass Index (BMI) 40.0 and over, adult: Secondary | ICD-10-CM | POA: Insufficient documentation

## 2019-03-07 DIAGNOSIS — G43909 Migraine, unspecified, not intractable, without status migrainosus: Secondary | ICD-10-CM | POA: Insufficient documentation

## 2019-03-07 DIAGNOSIS — Z23 Encounter for immunization: Secondary | ICD-10-CM

## 2019-03-07 NOTE — Assessment & Plan Note (Signed)
Recommend  focus on healthy diet choices and regular physical activity (30 minutes 5 days a week).  Focus on small goals at a time.  

## 2019-03-07 NOTE — Assessment & Plan Note (Signed)
Chronic, stable with no medications at this time.  Continue to monitor and adjust regimen as needed based on symptoms and frequency.

## 2019-03-07 NOTE — Assessment & Plan Note (Signed)
Recommend focus on small goals with diet changes and regular exercise.  Plan on follow-up in 4 weeks and will obtain baseline labs.

## 2019-03-07 NOTE — Patient Instructions (Signed)

## 2019-03-07 NOTE — Progress Notes (Signed)
New Patient Office Visit  Subjective:  Patient ID: Mary Bradford, female    DOB: 10/15/1985  Age: 33 y.o. MRN: [id number]  CC:  Chief Complaint  Patient presents with  . Establish Care    HPI Mary Bradford presents for new patient visit to establish care.  Introduced to Designer, jewellery role and practice setting.  All questions answered.  Has history of IVF and has two-year-old son at home.  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  Brings form in today to fill out to continue virtual classes if they open schools back up in January.    No current chronic illness or concerns.  Has history of migraines, which she reports minimal issues with.  Was also told she had PCOS at one time, but reports that her fertility providers did not find this.  Past Medical History:  Diagnosis Date  . History of PCOS   . Migraines   . Miscarriage    2018  . Newborn product of in vitro fertilization (IVF) pregnancy     Past Surgical History:  Procedure Laterality Date  . CESAREAN SECTION N/A 05/12/2017   Procedure: CESAREAN SECTION;  Surgeon: Everett Graff, MD;  Location: [facility];  Service: Obstetrics;  Laterality: N/A;  RNFA Requested OPERATIVE ULTRASOUND BEFORE CESAREAN TO CONFIRM PRESENTATION  . egg  retrieval    . WISDOM TOOTH EXTRACTION      Family History  Problem Relation Age of Onset  . Kidney Stones Mother   . Hypertension Mother   . Hypertension Father   . Kidney Stones Father   . Migraines Father   . Diabetes Father   . Irritable bowel syndrome Sister   . Migraines Sister   . Kidney disease Sister   . Leukemia Sister   . Heart disease Maternal Grandmother   . Diabetes Maternal Grandmother   . Non-Hodgkin's lymphoma Paternal Grandfather   . Leukemia Paternal Grandfather   . Hodgkin's lymphoma Paternal Grandfather   . Leukemia Maternal Uncle   . Diabetes Maternal Grandfather   . Dementia Maternal Grandfather       Social History   Socioeconomic History  . Marital status: Married    Spouse name: Not on file  . Number of children: 1  . Years of education: Not on file  . Highest education level: Not on file  Occupational History  . Occupation: Pharmacist, hospital    Comment: Pension scheme manager  Tobacco Use  . Smoking status: Never Smoker  . Smokeless tobacco: Never Used  Substance and Sexual Activity  . Alcohol use: No  . Drug use: No  . Sexual activity: Yes  Other Topics Concern  . Not on file  Social History Narrative  . Not on file   Social Determinants of Health   Financial Resource Strain: Low Risk   . Difficulty of Paying Living Expenses: Not very hard  Food Insecurity: No Food Insecurity  . Worried About Charity fundraiser in the Last Year: Never true  . Ran Out of Food in the Last Year: Never true  Transportation Needs: No Transportation Needs  . Lack of Transportation (Medical): No  . Lack of Transportation (Non-Medical): No  Physical Activity: Inactive  . Days of Exercise per Week: 0 days  . Minutes of Exercise per Session: 0 min  Stress: No Stress Concern Present  . Feeling of Stress : Only a little  Social Connections: Somewhat Isolated  . Frequency of Communication with Friends and  Family: Three times a week  . Frequency of Social Gatherings with Friends and Family: Three times a week  . Attends Religious Services: Never  . Active Member of Clubs or Organizations: No  . Attends BankerClub or Organization Meetings: Never  . Marital Status: Married  Catering managerntimate Partner Violence: Not At Risk  . Fear of Current or Ex-Partner: No  . Emotionally Abused: No  . Physically Abused: No  . Sexually Abused: No    ROS Review of Systems  Constitutional: Negative for activity change, appetite change, diaphoresis, fatigue and fever.  Respiratory: Negative.   Cardiovascular: Negative.   Gastrointestinal: Negative.   Endocrine: Negative.   Neurological: Negative.    Psychiatric/Behavioral: Negative.     Objective:   Today's Vitals: BP 136/74 (BP Location: Left Arm)   Pulse 88   Temp 97.7 F (36.5 C) (Oral)   Ht 5' 7.5" (1.715 m)   Wt 268 lb (121.6 kg)   LMP 02/22/2019   SpO2 97%   BMI 41.36 kg/m   Physical Exam Vitals and nursing note reviewed.  Constitutional:      General: She is awake. She is not in acute distress.    Appearance: She is well-developed. She is morbidly obese. She is not ill-appearing.  HENT:     Head: Normocephalic.     Right Ear: Hearing normal.     Left Ear: Hearing normal.  Eyes:     General: Lids are normal.        Right eye: No discharge.        Left eye: No discharge.     Conjunctiva/sclera: Conjunctivae normal.     Pupils: Pupils are equal, round, and reactive to light.  Neck:     Thyroid: No thyromegaly.     Vascular: No carotid bruit.  Cardiovascular:     Rate and Rhythm: Normal rate and regular rhythm.     Heart sounds: Normal heart sounds. No murmur. No gallop.   Pulmonary:     Effort: Pulmonary effort is normal. No accessory muscle usage or respiratory distress.     Breath sounds: Normal breath sounds.  Abdominal:     General: Bowel sounds are normal.     Palpations: Abdomen is soft.  Musculoskeletal:     Cervical back: Normal range of motion and neck supple.     Right lower leg: No edema.     Left lower leg: No edema.  Skin:    General: Skin is warm and dry.  Neurological:     Mental Status: She is alert and oriented to person, place, and time.  Psychiatric:        Attention and Perception: Attention normal.        Mood and Affect: Mood normal.        Behavior: Behavior normal. Behavior is cooperative.        Thought Content: Thought content normal.        Judgment: Judgment normal.     Assessment & Plan:   Problem List Items Addressed This Visit      Cardiovascular and Mediastinum   Migraines    Chronic, stable with no medications at this time.  Continue to monitor and adjust  regimen as needed based on symptoms and frequency.        Other   BMI 40.0-44.9, adult (HCC)    Recommend  focus on healthy diet choices and regular physical activity (30 minutes 5 days a week).  Focus on small goals at a time.  Morbid obesity (HCC) - Primary    Recommend focus on small goals with diet changes and regular exercise.  Plan on follow-up in 4 weeks and will obtain baseline labs.       Other Visit Diagnoses    Need for influenza vaccination       Relevant Orders   Flu Vaccine QUAD 36+ mos PF IM (Fluarix & Fluzone Quad PF) (Completed)      Outpatient Encounter Medications as of 03/07/2019  Medication Sig  . [DISCONTINUED] acetaminophen (TYLENOL) 500 MG tablet Take 1,000 mg by mouth every 8 (eight) hours as needed for headache.  . [DISCONTINUED] calcium carbonate (TUMS - DOSED IN MG ELEMENTAL CALCIUM) 500 MG chewable tablet Chew 2 tablets by mouth daily as needed for indigestion or heartburn.  . [DISCONTINUED] Docusate Sodium (COLACE PO) Take 1-2 tablets by mouth daily as needed (1-2 tablets depending on constipation).  . [DISCONTINUED] ibuprofen (ADVIL,MOTRIN) 600 MG tablet Take 1 tablet (600 mg total) by mouth every 6 (six) hours.  . [DISCONTINUED] oxyCODONE (OXY IR/ROXICODONE) 5 MG immediate release tablet Take 1 tablet (5 mg total) by mouth every 4 (four) hours as needed (pain scale 4-7).  . [DISCONTINUED] Prenatal Vit-Fe Fumarate-FA (PRENATAL MULTIVITAMIN) TABS tablet Take 1 tablet by mouth daily at 12 noon.   No facility-administered encounter medications on file as of 03/07/2019.    Follow-up: Return in about 4 weeks (around 04/04/2019) for Follow-up.   Marjie Skiff, NP

## 2019-03-24 ENCOUNTER — Ambulatory Visit: Payer: Self-pay | Admitting: Nurse Practitioner

## 2019-04-04 ENCOUNTER — Ambulatory Visit (INDEPENDENT_AMBULATORY_CARE_PROVIDER_SITE_OTHER): Payer: Self-pay | Admitting: Nurse Practitioner

## 2019-04-04 ENCOUNTER — Other Ambulatory Visit: Payer: Self-pay

## 2019-04-04 ENCOUNTER — Encounter: Payer: Self-pay | Admitting: Nurse Practitioner

## 2019-04-04 DIAGNOSIS — F320 Major depressive disorder, single episode, mild: Secondary | ICD-10-CM | POA: Insufficient documentation

## 2019-04-04 DIAGNOSIS — Z806 Family history of leukemia: Secondary | ICD-10-CM

## 2019-04-04 DIAGNOSIS — F4321 Adjustment disorder with depressed mood: Secondary | ICD-10-CM

## 2019-04-04 NOTE — Patient Instructions (Signed)
Managing Loss, Adult People experience loss in many different ways throughout their lives. Events such as moving, changing jobs, and losing friends can create a sense of loss. The loss may be as serious as a major health change, divorce, death of a pet, or death of a loved one. All of these types of loss are likely to create a physical and emotional reaction known as grief. Grief is the result of a major change or an absence of something or someone that you count on. Grief is a normal reaction to loss. A variety of factors can affect your grieving experience, including:  The nature of your loss.  Your relationship to what or whom you lost.  Your understanding of grief and how to manage it.  Your support system. How to manage lifestyle changes Keep to your normal routine as much as possible.  If you have trouble focusing or doing normal activities, it is acceptable to take some time away from your normal routine.  Spend time with friends and loved ones.  Eat a healthy diet, get plenty of sleep, and rest when you feel tired. How to recognize changes  The way that you deal with your grief will affect your ability to function as you normally do. When grieving, you may experience these changes:  Numbness, shock, sadness, anxiety, anger, denial, and guilt.  Thoughts about death.  Unexpected crying.  A physical sensation of emptiness in your stomach.  Problems sleeping and eating.  Tiredness (fatigue).  Loss of interest in normal activities.  Dreaming about or imagining seeing the person who died.  A need to remember what or whom you lost.  Difficulty thinking about anything other than your loss for a period of time.  Relief. If you have been expecting the loss for a while, you may feel a sense of relief when it happens. Follow these instructions at home:  Activity Express your feelings in healthy ways, such as:  Talking with others about your loss. It may be helpful to find  others who have had a similar loss, such as a support group.  Writing down your feelings in a journal.  Doing physical activities to release stress and emotional energy.  Doing creative activities like painting, sculpting, or playing or listening to music.  Practicing resilience. This is the ability to recover and adjust after facing challenges. Reading some resources that encourage resilience may help you to learn ways to practice those behaviors. General instructions  Be patient with yourself and others. Allow the grieving process to happen, and remember that grieving takes time. ? It is likely that you may never feel completely done with some grief. You may find a way to move on while still cherishing memories and feelings about your loss. ? Accepting your loss is a process. It can take months or longer to adjust.  Keep all follow-up visits as told by your health care provider. This is important. Where to find support To get support for managing loss:  Ask your health care provider for help and recommendations, such as grief counseling or therapy.  Think about joining a support group for people who are managing a loss. Where to find more information You can find more information about managing loss from:  American Society of Clinical Oncology: www.cancer.net  American Psychological Association: www.apa.org Contact a health care provider if:  Your grief is extreme and keeps getting worse.  You have ongoing grief that does not improve.  Your body shows symptoms of grief, such   as illness.  You feel depressed, anxious, or lonely. Get help right away if:  You have thoughts about hurting yourself or others. If you ever feel like you may hurt yourself or others, or have thoughts about taking your own life, get help right away. You can go to your nearest emergency department or call:  Your local emergency services (911 in the [place]  A suicide crisis helpline, such as the  National Suicide Prevention Lifeline at 1-800-273-8255. This is open 24 hours a day. Summary  Grief is the result of a major change or an absence of someone or something that you count on. Grief is a normal reaction to loss.  The depth of grief and the period of recovery depend on the type of loss and your ability to adjust to the change and process your feelings.  Processing grief requires patience and a willingness to accept your feelings and talk about your loss with people who are supportive.  It is important to find resources that work for you and to realize that people experience grief differently. There is not one grieving process that works for everyone in the same way.  Be aware that when grief becomes extreme, it can lead to more severe issues like isolation, depression, anxiety, or suicidal thoughts. Talk with your health care provider if you have any of these issues. This information is not intended to replace advice given to you by your health care provider. Make sure you discuss any questions you have with your health care provider. Document Revised: 05/17/2018 Document Reviewed: 07/27/2016 Elsevier Patient Education  2020 Elsevier Inc.  

## 2019-04-04 NOTE — Assessment & Plan Note (Signed)
Referral placed to [facility] for genetic counseling and evaluation.

## 2019-04-04 NOTE — Progress Notes (Signed)
There were no vitals taken for this visit.   Subjective:    Patient ID: Mary Bradford, female    DOB: 1986/03/09, 35 y.o.   MRN: [id number]  HPI: Mary Bradford is a 34 y.o. female  Chief Complaint  Patient presents with  . Depression    recent death in the family  . Genetic Evaluation    . This visit was completed via FaceTime due to the restrictions of the COVID-19 pandemic. All issues as above were discussed and addressed. Physical exam was done as above through visual confirmation on FaceTime. If it was felt that the patient should be evaluated in the office, they were directed there. The patient verbally consented to this visit. . Location of the patient: home . Location of the provider: home . Those involved with this call:  . Provider: Marnee Guarneri, DNP . CMA: Yvonna Alanis, CMA . Front Desk/Registration: Don Perking  . Time spent on call: 15 minutes with patient face to face via video conference. More than 50% of this time was spent in counseling and coordination of care. 10 minutes total spent in review of patient's record and preparation of their chart.  . I verified patient identity using two factors (patient name and date of birth). Patient consents verbally to being seen via telemedicine visit today.   DEPRESSION She reports her sister passed away three days before Christmas (she was 30), went to hospital and WBC was 300,000.  Told her she had AML and was provided treatment at [facility].  Had side effects from this treatment and passed away.  Her sister had two small children, who parents are taking care of now and patient is helping them with this.  Her sister was separated from her husband.    Does feel in past she had some postpartum depression after her son was born.  Has never taken medications for mood.  We discussed at length the grieving process.  At this time she would prefer not to take medication.  Did try to reach out to therapist, but  reports no one has gotten back to her or some are not taking patients.  Educated her on [facility].   Mood status: stable Satisfied with current treatment?: yes Symptom severity: moderate  Duration of current treatment : chronic Side effects: no Psychotherapy/counseling: has attempted to obtain, but has had no success. 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 Anhedonia: no Significant weight loss or gain: no Insomnia: none Fatigue: yes Feelings of worthlessness or guilt: no Impaired concentration/indecisiveness: no , able to teach without issue and focus Suicidal ideations: no Hopelessness: no Crying spells: yes Depression screen [facility] 2/9 04/04/2019 03/07/2019  Decreased Interest 2 0  Down, Depressed, Hopeless 3 0  PHQ - 2 Score 5 0  Altered sleeping 1 -  Tired, decreased energy 3 -  Change in appetite 3 -  Feeling bad or failure about yourself  0 -  Trouble concentrating 1 -  Moving slowly or fidgety/restless 0 -  Suicidal thoughts 0 -  PHQ-9 Score 13 -  Difficult doing work/chores Somewhat difficult -   GAD 7 : Generalized Anxiety Score 04/04/2019  Nervous, Anxious, on Edge 1  Control/stop worrying 2  Worry too much - different things 3  Trouble relaxing 2  Restless 0  Easily annoyed or irritable 1  Afraid -  awful might happen 1  Total GAD 7 Score 10  Anxiety Difficulty Very difficult   GENETIC EVALUATION: Reports her sister's AML was a rare type which was reported to her by [facility] provider + her mom's brother passed away in 30's from AML.  It was recommended by oncology at [facility] that patient obtain genetic counselling and evaluation with work-up.  Relevant past medical, surgical, family and social history reviewed and updated as indicated. Interim medical history since our last visit reviewed. Allergies and  medications reviewed and updated.  Review of Systems  Constitutional: Negative for activity change, appetite change, diaphoresis, fatigue and fever.  Respiratory: Negative for cough, chest tightness and shortness of breath.   Cardiovascular: Negative for chest pain, palpitations and leg swelling.  Endocrine: Negative for polyuria.  Psychiatric/Behavioral: Negative for decreased concentration, self-injury, sleep disturbance and suicidal ideas. The patient is nervous/anxious.     Per HPI unless specifically indicated above     Objective:    There were no vitals taken for this visit.  Wt Readings from Last 3 Encounters:  03/07/19 268 lb (121.6 kg)  05/12/17 281 lb 6.4 oz (127.6 kg)  05/10/17 273 lb (123.8 kg)    Physical Exam Vitals and nursing note reviewed.  Constitutional:      General: She is awake. She is not in acute distress.    Appearance: She is well-developed. She is not ill-appearing.  HENT:     Head: Normocephalic.     Right Ear: Hearing normal.     Left Ear: Hearing normal.  Eyes:     General: Lids are normal.        Right eye: No discharge.        Left eye: No discharge.     Conjunctiva/sclera: Conjunctivae normal.  Pulmonary:     Effort: Pulmonary effort is normal. No accessory muscle usage or respiratory distress.  Musculoskeletal:     Cervical back: Normal range of motion.  Neurological:     Mental Status: She is alert and oriented to person, place, and time.  Psychiatric:        Attention and Perception: Attention normal.        Mood and Affect: Mood normal.        Behavior: Behavior normal. Behavior is cooperative.        Thought Content: Thought content normal.        Judgment: Judgment normal.     Results for orders placed or performed during the hospital encounter of 05/12/17  Hepatitis B surface antigen  Result Value Ref Range   Hepatitis B Surface Ag Negative Negative  Rubella antibody, IgM  Result Value Ref Range   Rubella IgM <20.0 0.0 -  19.9 AU/mL  CBC  Result Value Ref Range   WBC 17.1 (H) 4.0 - 10.5 K/uL   RBC 3.24 (L) 3.87 - 5.11 MIL/uL   Hemoglobin 9.1 (L) 12.0 - 15.0 g/dL   HCT 06.2 (L) 69.4 - 85.4 %   MCV 82.4 78.0 - 100.0 fL   MCH 28.1 26.0 - 34.0 pg   MCHC 34.1 30.0 - 36.0 g/dL   RDW 62.7 03.5 - 00.9 %   Platelets 243 150 - 400 K/uL  Rubella screen  Result Value Ref Range   Rubella 1.30 Immune >0.99 index  Type and screen All Cardiac and thoracic surgeries, spinal fusions, myomectomies, craniotomies, colon & liver resections, total joint revisions, same day c-section with placenta previa or accreta.  Result Value Ref Range   ABO/RH(D)  B NEG    Antibody Screen POS    Sample Expiration 05/12/2017       Assessment & Plan:   Problem List Items Addressed This Visit      Other   Grief - Primary    Lengthy discussion with patient, at this time she wishes not to start any medication.  Denies SI/HI.  Urgent referral to therapy placed.  Recommend she contact [facility] to discuss grief support group and provided her with number to contact.  Will follow-up with her in 4 weeks or sooner if worsening symptoms.       Relevant Orders   Ambulatory referral to Psychology   Family history of AML (acute myeloid leukemia)    Referral placed to [facility] for genetic counseling and evaluation.      Relevant Orders   Ambulatory referral to Genetics      I discussed the assessment and treatment plan with the patient. The patient was provided an opportunity to ask questions and all were answered. The patient agreed with the plan and demonstrated an understanding of the instructions.   The patient was advised to call back or seek an in-person evaluation if the symptoms worsen or if the condition fails to improve as anticipated.   I provided 15 minutes of time during this encounter.  Follow up plan: Return in about 4 weeks (around 05/02/2019) for Grief.

## 2019-04-04 NOTE — Assessment & Plan Note (Signed)
Lengthy discussion with patient, at this time she wishes not to start any medication.  Denies SI/HI.  Urgent referral to therapy placed.  Recommend she contact [facility] to discuss grief support group and provided her with number to contact.  Will follow-up with her in 4 weeks or sooner if worsening symptoms.

## 2019-04-08 ENCOUNTER — Encounter: Payer: Self-pay | Admitting: Nurse Practitioner

## 2019-04-08 NOTE — Progress Notes (Signed)
Lvm to make 4 week appoitment, sent letter.

## 2019-06-04 ENCOUNTER — Encounter: Payer: Self-pay | Admitting: Nurse Practitioner

## 2019-06-04 ENCOUNTER — Encounter: Payer: BC Managed Care – PPO | Admitting: Nurse Practitioner

## 2019-06-04 ENCOUNTER — Telehealth (INDEPENDENT_AMBULATORY_CARE_PROVIDER_SITE_OTHER): Payer: BC Managed Care – PPO | Admitting: Nurse Practitioner

## 2019-06-04 DIAGNOSIS — Z806 Family history of leukemia: Secondary | ICD-10-CM

## 2019-06-04 DIAGNOSIS — F320 Major depressive disorder, single episode, mild: Secondary | ICD-10-CM

## 2019-06-04 MED ORDER — SERTRALINE HCL 50 MG PO TABS: 50.0000 mg | ORAL_TABLET | Freq: Every day | ORAL | 3 refills | Status: DC

## 2019-06-04 NOTE — Assessment & Plan Note (Signed)
Will look into referral to genetic counseling and provided her with number for [facility] clinic.

## 2019-06-04 NOTE — Progress Notes (Signed)
There were no vitals taken for this visit.   Subjective:    Patient ID: Mary Bradford, female    DOB: [birth year], 34 y.o.   MRN: [id number]  HPI: Mary Bradford is a 34 y.o. female  Chief Complaint  Patient presents with  . Follow-up    . This visit was completed via MyChart due to the restrictions of the COVID-19 pandemic. All issues as above were discussed and addressed. Physical exam was done as above through visual confirmation on MyChart. If it was felt that the patient should be evaluated in the office, they were directed there. The patient verbally consented to this visit. . Location of the patient: home . Location of the provider: work . Those involved with this call:  . Provider: Aura Dials, DNP . CMA: Wilhemena Durie, CMA . Front Desk/Registration: Adela Ports  . Time spent on call: 15 minutes with patient face to face via video conference. More than 50% of this time was spent in counseling and coordination of care. 10 minutes total spent in review of patient's record and preparation of their chart.  . I verified patient identity using two factors (patient name and date of birth). Patient consents verbally to being seen via telemedicine visit today.   DEPRESSION Her sister passed away three days before Christmas 2020 (she was 30), went to hospital and WBC was 300,000.  Told her she had AML and was provided treatment at [facility].  Had side effects from this treatment and passed away.  Her sister had two small children, who parents are taking care of now and patient is helping them with this.  She has been going to therapy with [facility] in [place].  Was referred for genetic testing at [facility] last visit, but has not heard from them.  Does feel in past she had some postpartum depression after her son was born.  Has never taken medications for mood.  We discussed at length the grieving process.  At last visit she did not want to start medication, but at  this time she does report wishes to try medication.  Educated her on current guidelines and use of SSRI, she is interested in starting this.  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 Anhedonia: no Significant weight loss or gain: no Insomnia: none Fatigue: yes Feelings of worthlessness or guilt: no Impaired concentration/indecisiveness: yes Suicidal ideations: no Hopelessness: no Crying spells: yes Depression screen [facility] 2/9 06/04/2019 04/04/2019 03/07/2019  Decreased Interest 2 2 0  Down, Depressed, Hopeless 2 3 0  PHQ - 2 Score 4 5 0  Altered sleeping 3 1 -  Tired, decreased energy 3 3 -  Change in appetite 1 3 -  Feeling bad or failure about yourself  0 0 -  Trouble concentrating 1 1 -  Moving slowly or fidgety/restless 0 0 -  Suicidal thoughts 0 0 -  PHQ-9 Score 12 13 -  Difficult doing work/chores Somewhat difficult Somewhat difficult -   GAD 7 : Generalized Anxiety Score 06/04/2019 04/04/2019  Nervous, Anxious, on Edge 2 1  Control/stop worrying 3 2  Worry too much - different things 3 3  Trouble relaxing 3 2  Restless 2 0  Easily annoyed or irritable 3 1  Afraid - awful might happen 1 1  Total GAD 7 Score 17 10  Anxiety Difficulty Somewhat difficult Very difficult    Relevant past medical, surgical, family and social history reviewed and updated as indicated. Interim medical history since our last visit reviewed. Allergies and medications reviewed and updated.  Review of Systems  Constitutional: Negative for activity change, appetite change, diaphoresis, fatigue and fever.  Respiratory: Negative for cough, chest tightness and shortness of breath.   Cardiovascular: Negative for chest pain, palpitations and leg  swelling.  Gastrointestinal: Negative.   Neurological: Negative.   Psychiatric/Behavioral: Positive for decreased concentration and sleep disturbance. Negative for self-injury and suicidal ideas. The patient is nervous/anxious.     Per HPI unless specifically indicated above     Objective:    There were no vitals taken for this visit.  Wt Readings from Last 3 Encounters:  03/07/19 268 lb (121.6 kg)  05/12/17 281 lb 6.4 oz (127.6 kg)  05/10/17 273 lb (123.8 kg)    Physical Exam Vitals and nursing note reviewed.  Constitutional:      General: She is awake. She is not in acute distress.    Appearance: She is well-developed. She is obese. She is not ill-appearing.  HENT:     Head: Normocephalic.     Right Ear: Hearing normal.     Left Ear: Hearing normal.  Eyes:     General: Lids are normal.        Right eye: No discharge.        Left eye: No discharge.     Conjunctiva/sclera: Conjunctivae normal.  Pulmonary:     Effort: Pulmonary effort is normal. No accessory muscle usage or respiratory distress.  Musculoskeletal:     Cervical back: Normal range of motion.  Neurological:     Mental Status: She is alert and oriented to person, place, and time.  Psychiatric:        Attention and Perception: Attention normal.        Mood and Affect: Mood normal.        Behavior: Behavior normal. Behavior is cooperative.        Thought Content: Thought content normal.        Judgment: Judgment normal.     Results for orders placed or performed during the hospital encounter of 05/12/17  Hepatitis B surface antigen  Result Value Ref Range   Hepatitis B Surface Ag Negative Negative  Rubella antibody, IgM  Result Value Ref Range   Rubella IgM <20.0 0.0 - 19.9 AU/mL  CBC  Result Value Ref Range   WBC 17.1 (H) 4.0 - 10.5 K/uL   RBC 3.24 (L) 3.87 - 5.11 MIL/uL   Hemoglobin 9.1 (L) 12.0 - 15.0 g/dL   HCT 26.7 (L) 36.0 - 46.0 %   MCV 82.4 78.0 - 100.0 fL   MCH 28.1 26.0 - 34.0 pg   MCHC  34.1 30.0 - 36.0 g/dL   RDW 14.7 11.5 - 15.5 %   Platelets 243 150 - 400 K/uL  Rubella screen  Result Value Ref Range   Rubella 1.30 Immune >0.99 index  Type and screen All Cardiac and thoracic surgeries, spinal fusions, myomectomies, craniotomies, colon & liver resections, total joint revisions, same day c-section with placenta previa or accreta.  Result Value Ref Range   ABO/RH(D) B NEG    Antibody Screen POS    Sample Expiration 05/12/2017       Assessment & Plan:   Problem List Items Addressed This Visit      Other  Depression, major, single episode, mild (HCC) - Primary    Ongoing with grief related to loss of her sister. PHQ9 = 12 and GAD = 17.  Denies SI/HI.  Does wish to start medication.  Educated her on SSRI family.  Will start Sertraline 50 MG daily, script sent.  She is to continue with her therapist as scheduled.  Return in 4 weeks for follow-up and medication adjustment if needed.      Relevant Medications   sertraline (ZOLOFT) 50 MG tablet   Family history of AML (acute myeloid leukemia)    Will look into referral to genetic counseling and provided her with number for [facility].         I discussed the assessment and treatment plan with the patient. The patient was provided an opportunity to ask questions and all were answered. The patient agreed with the plan and demonstrated an understanding of the instructions.   The patient was advised to call back or seek an in-person evaluation if the symptoms worsen or if the condition fails to improve as anticipated.   I provided 15+ minutes of time during this encounter.  Follow up plan: Return in about 4 weeks (around 07/02/2019) for Mood.

## 2019-06-04 NOTE — Assessment & Plan Note (Signed)
Ongoing with grief related to loss of her sister. PHQ9 = 12 and GAD = 17.  Denies SI/HI.  Does wish to start medication.  Educated her on SSRI family.  Will start Sertraline 50 MG daily, script sent.  She is to continue with her therapist as scheduled.  Return in 4 weeks for follow-up and medication adjustment if needed.

## 2019-06-04 NOTE — Patient Instructions (Signed)
Living With Depression Everyone experiences occasional disappointment, sadness, and loss in their lives. When you are feeling down, blue, or sad for at least 2 weeks in a row, it may mean that you have depression. Depression can affect your thoughts and feelings, relationships, daily activities, and physical health. It is caused by changes in the way your brain functions. If you receive a diagnosis of depression, your health care provider will tell you which type of depression you have and what treatment options are available to you. If you are living with depression, there are ways to help you recover from it and also ways to prevent it from coming back. How to cope with lifestyle changes Coping with stress     Stress is your body's reaction to life changes and events, both good and bad. Stressful situations may include:  Getting married.  The death of a spouse.  Losing a job.  Retiring.  Having a baby. Stress can last just a few hours or it can be ongoing. Stress can play a major role in depression, so it is important to learn both how to cope with stress and how to think about it differently. Talk with your health care provider or a counselor if you would like to learn more about stress reduction. He or she may suggest some stress reduction techniques, such as:  Music therapy. This can include creating music or listening to music. Choose music that you enjoy and that inspires you.  Mindfulness-based meditation. This kind of meditation can be done while sitting or walking. It involves being aware of your normal breaths, rather than trying to control your breathing.  Centering prayer. This is a kind of meditation that involves focusing on a spiritual word or phrase. Choose a word, phrase, or sacred image that is meaningful to you and that brings you peace.  Deep breathing. To do this, expand your stomach and inhale slowly through your nose. Hold your breath for 3-5 seconds, then exhale  slowly, allowing your stomach muscles to relax.  Muscle relaxation. This involves intentionally tensing muscles then relaxing them. Choose a stress reduction technique that fits your lifestyle and personality. Stress reduction techniques take time and practice to develop. Set aside 5-15 minutes a day to do them. Therapists can offer training in these techniques. The training may be covered by some insurance plans. Other things you can do to manage stress include:  Keeping a stress diary. This can help you learn what triggers your stress and ways to control your response.  Understanding what your limits are and saying no to requests or events that lead to a schedule that is too full.  Thinking about how you respond to certain situations. You may not be able to control everything, but you can control how you react.  Adding humor to your life by watching funny films or TV shows.  Making time for activities that help you relax and not feeling guilty about spending your time this way.  Medicines Your health care provider may suggest certain medicines if he or she feels that they will help improve your condition. Avoid using alcohol and other substances that may prevent your medicines from working properly (may interact). It is also important to:  Talk with your pharmacist or health care provider about all the medicines that you take, their possible side effects, and what medicines are safe to take together.  Make it your goal to take part in all treatment decisions (shared decision-making). This includes giving input on   the side effects of medicines. It is best if shared decision-making with your health care provider is part of your total treatment plan. If your health care provider prescribes a medicine, you may not notice the full benefits of it for 4-8 weeks. Most people who are treated for depression need to be on medicine for at least 6-12 months after they feel better. If you are taking  medicines as part of your treatment, do not stop taking medicines without first talking to your health care provider. You may need to have the medicine slowly decreased (tapered) over time to decrease the risk of harmful side effects. Relationships Your health care provider may suggest family therapy along with individual therapy and drug therapy. While there may not be family problems that are causing you to feel depressed, it is still important to make sure your family learns as much as they can about your mental health. Having your family's support can help make your treatment successful. How to recognize changes in your condition Everyone has a different response to treatment for depression. Recovery from major depression happens when you have not had signs of major depression for two months. This may mean that you will start to:  Have more interest in doing activities.  Feel less hopeless than you did 2 months ago.  Have more energy.  Overeat less often, or have better or improving appetite.  Have better concentration. Your health care provider will work with you to decide the next steps in your recovery. It is also important to recognize when your condition is getting worse. Watch for these signs:  Having fatigue or low energy.  Eating too much or too little.  Sleeping too much or too little.  Feeling restless, agitated, or hopeless.  Having trouble concentrating or making decisions.  Having unexplained physical complaints.  Feeling irritable, angry, or aggressive. Get help as soon as you or your family members notice these symptoms coming back. How to get support and help from others How to talk with friends and family members about your condition  Talking to friends and family members about your condition can provide you with one way to get support and guidance. Reach out to trusted friends or family members, explain your symptoms to them, and let them know that you are  working with a health care provider to treat your depression. Financial resources Not all insurance plans cover mental health care, so it is important to check with your insurance carrier. If paying for co-pays or counseling services is a problem, search for a local or county mental health care center. They may be able to offer public mental health care services at low or no cost when you are not able to see a private health care provider. If you are taking medicine for depression, you may be able to get the generic form, which may be less expensive. Some makers of prescription medicines also offer help to patients who cannot afford the medicines they need. Follow these instructions at home:   Get the right amount and quality of sleep.  Cut down on using caffeine, tobacco, alcohol, and other potentially harmful substances.  Try to exercise, such as walking or lifting small weights.  Take over-the-counter and prescription medicines only as told by your health care provider.  Eat a healthy diet that includes plenty of vegetables, fruits, whole grains, low-fat dairy products, and lean protein. Do not eat a lot of foods that are high in solid fats, added sugars, or salt.    Keep all follow-up visits as told by your health care provider. This is important. Contact a health care provider if:  You stop taking your antidepressant medicines, and you have any of these symptoms: ? Nausea. ? Headache. ? Feeling lightheaded. ? Chills and body aches. ? Not being able to sleep (insomnia).  You or your friends and family think your depression is getting worse. Get help right away if:  You have thoughts of hurting yourself or others. If you ever feel like you may hurt yourself or others, or have thoughts about taking your own life, get help right away. You can go to your nearest emergency department or call:  Your local emergency services (911 in the [place]  A suicide crisis helpline, such as the  National Suicide Prevention Lifeline at 1-800-273-8255. This is open 24-hours a day. Summary  If you are living with depression, there are ways to help you recover from it and also ways to prevent it from coming back.  Work with your health care team to create a management plan that includes counseling, stress management techniques, and healthy lifestyle habits. This information is not intended to replace advice given to you by your health care provider. Make sure you discuss any questions you have with your health care provider. Document Revised: 07/05/2018 Document Reviewed: 02/14/2016 Elsevier Patient Education  2020 Elsevier Inc.  

## 2019-07-03 ENCOUNTER — Telehealth (INDEPENDENT_AMBULATORY_CARE_PROVIDER_SITE_OTHER): Payer: BC Managed Care – PPO | Admitting: Nurse Practitioner

## 2019-07-03 ENCOUNTER — Encounter: Payer: Self-pay | Admitting: Nurse Practitioner

## 2019-07-03 DIAGNOSIS — F320 Major depressive disorder, single episode, mild: Secondary | ICD-10-CM | POA: Diagnosis not present

## 2019-07-03 DIAGNOSIS — Z806 Family history of leukemia: Secondary | ICD-10-CM

## 2019-07-03 MED ORDER — SERTRALINE HCL 50 MG PO TABS: 75.0000 mg | ORAL_TABLET | Freq: Every day | ORAL | 3 refills | Status: DC

## 2019-07-03 NOTE — Assessment & Plan Note (Signed)
Will look into referral to genetic counseling and provided her with number for [facility] clinic.

## 2019-07-03 NOTE — Progress Notes (Signed)
There were no vitals taken for this visit.   Subjective:    Patient ID: Mary Bradford, female    DOB: [birth year], 34 y.o.   MRN: [id number]  HPI: Mary Bradford is a 34 y.o. female  Chief Complaint  Patient presents with  . Depression    . This visit was completed via MyChart due to the restrictions of the COVID-19 pandemic. All issues as above were discussed and addressed. Physical exam was done as above through visual confirmation on MyChart. If it was felt that the patient should be evaluated in the office, they were directed there. The patient verbally consented to this visit. . Location of the patient: home . Location of the provider: home . Those involved with this call:  . Provider: Aura Dials, DNP . CMA: Wilhemena Durie, CMA . Front Desk/Registration: Adela Ports  . Time spent on call: 15 minutes with patient face to face via video conference. More than 50% of this time was spent in counseling and coordination of care. 10 minutes total spent in review of patient's record and preparation of their chart.  . I verified patient identity using two factors (patient name and date of birth). Patient consents verbally to being seen via telemedicine visit today.    DEPRESSION Was started on Sertraline 50 MG last visit and reports improvement in mood.  Did have to go through a holiday and her sister's birthday, which were difficult times, but overall seeing improvement in mood.  Is interested in increasing to 75 MG for more benefit. Mood status: improved with medication Satisfied with current treatment?: yes Symptom severity: mild  Duration of current treatment : months Side effects: no Medication compliance: good compliance Psychotherapy/counseling: current Depressed mood: occasional, but improved Anxious mood: no Anhedonia: no Significant weight loss or gain: no Insomnia: yes hard to fall asleep Fatigue: a little bit Feelings of worthlessness or  guilt: no Impaired concentration/indecisiveness: no Suicidal ideations: no Hopelessness: no Crying spells: occasional due to holidays Depression screen [facility] 2/9 07/03/2019 06/04/2019 04/04/2019 03/07/2019  Decreased Interest 0 2 2 0  Down, Depressed, Hopeless 2 2 3  0  PHQ - 2 Score 2 4 5  0  Altered sleeping 1 3 1  -  Tired, decreased energy 1 3 3  -  Change in appetite 1 1 3  -  Feeling bad or failure about yourself  0 0 0 -  Trouble concentrating 1 1 1  -  Moving slowly or fidgety/restless 0 0 0 -  Suicidal thoughts 0 0 0 -  PHQ-9 Score 6 12 13  -  Difficult doing work/chores Somewhat difficult Somewhat difficult Somewhat difficult -   GAD 7 : Generalized Anxiety Score 07/03/2019 06/04/2019 04/04/2019  Nervous, Anxious, on Edge 0 2 1  Control/stop worrying 1 3 2   Worry too much - different things 1 3 3   Trouble relaxing 1 3 2   Restless 0 2 0  Easily annoyed or irritable 0 3 1  Afraid - awful might happen 0 1 1  Total GAD 7 Score 3 17 10   Anxiety Difficulty Somewhat difficult Somewhat difficult Very difficult    Relevant past medical, surgical, family and social history reviewed and updated as indicated. Interim medical history since our last visit reviewed. Allergies and medications reviewed and updated.  Review of Systems  Constitutional: Negative for activity change, appetite change, diaphoresis, fatigue and fever.  Respiratory: Negative for cough, chest tightness and shortness of breath.   Cardiovascular: Negative for chest pain, palpitations and leg swelling.  Gastrointestinal: Negative.  Neurological: Negative.   Psychiatric/Behavioral: Positive for sleep disturbance. Negative for decreased concentration, self-injury and suicidal ideas. The patient is not nervous/anxious.     Per HPI unless specifically indicated above     Objective:    There were no vitals taken for this visit.  Wt Readings from Last 3 Encounters:  03/07/19 268 lb (121.6 kg)  05/12/17 281 lb 6.4 oz (127.6  kg)  05/10/17 273 lb (123.8 kg)    Physical Exam Vitals and nursing note reviewed.  Constitutional:      General: She is awake. She is not in acute distress.    Appearance: She is well-developed. She is not ill-appearing.  HENT:     Head: Normocephalic.     Right Ear: Hearing normal.     Left Ear: Hearing normal.  Eyes:     General: Lids are normal.        Right eye: No discharge.        Left eye: No discharge.     Conjunctiva/sclera: Conjunctivae normal.  Pulmonary:     Effort: Pulmonary effort is normal. No accessory muscle usage or respiratory distress.  Musculoskeletal:     Cervical back: Normal range of motion.  Neurological:     Mental Status: She is alert and oriented to person, place, and time.  Psychiatric:        Attention and Perception: Attention normal.        Mood and Affect: Mood normal.        Behavior: Behavior normal. Behavior is cooperative.        Thought Content: Thought content normal.        Judgment: Judgment normal.     Results for orders placed or performed during the hospital encounter of 05/12/17  Hepatitis B surface antigen  Result Value Ref Range   Hepatitis B Surface Ag Negative Negative  Rubella antibody, IgM  Result Value Ref Range   Rubella IgM <20.0 0.0 - 19.9 AU/mL  CBC  Result Value Ref Range   WBC 17.1 (H) 4.0 - 10.5 K/uL   RBC 3.24 (L) 3.87 - 5.11 MIL/uL   Hemoglobin 9.1 (L) 12.0 - 15.0 g/dL   HCT 73.2 (L) 20.2 - 54.2 %   MCV 82.4 78.0 - 100.0 fL   MCH 28.1 26.0 - 34.0 pg   MCHC 34.1 30.0 - 36.0 g/dL   RDW 70.6 23.7 - 62.8 %   Platelets 243 150 - 400 K/uL  Rubella screen  Result Value Ref Range   Rubella 1.30 Immune >0.99 index  Type and screen All Cardiac and thoracic surgeries, spinal fusions, myomectomies, craniotomies, colon & liver resections, total joint revisions, same day c-section with placenta previa or accreta.  Result Value Ref Range   ABO/RH(D) B NEG    Antibody Screen POS    Sample Expiration 05/12/2017        Assessment & Plan:   Problem List Items Addressed This Visit      Other   Depression, major, single episode, mild (HCC) - Primary    Ongoing and improving, due to loss of sister.  PHQ from 12 to 6 and GAD from 17 to 3, much improved scores.  Will increase Sertraline to 75 MG daily, new script sent for 90 day supply with 3 refills.  She denies SI/HI.  Continues therapy sessions.  Return to office in 6 months or sooner if any worsening symptoms.      Relevant Medications   sertraline (ZOLOFT) 50 MG tablet  Family history of AML (acute myeloid leukemia)    Will look into referral to genetic counseling and provided her with number for [place] [facility].          I discussed the assessment and treatment plan with the patient. The patient was provided an opportunity to ask questions and all were answered. The patient agreed with the plan and demonstrated an understanding of the instructions.   The patient was advised to call back or seek an in-person evaluation if the symptoms worsen or if the condition fails to improve as anticipated.   I provided 15+ minutes of time during this encounter.  Follow up plan: Return in about 6 months (around 01/02/2020) for Mood.

## 2019-07-03 NOTE — Assessment & Plan Note (Signed)
Ongoing and improving, due to loss of sister.  PHQ from 12 to 6 and GAD from 17 to 3, much improved scores.  Will increase Sertraline to 75 MG daily, new script sent for 90 day supply with 3 refills.  She denies SI/HI.  Continues therapy sessions.  Return to office in 6 months or sooner if any worsening symptoms.

## 2019-07-03 NOTE — Patient Instructions (Signed)
Living With Depression Everyone experiences occasional disappointment, sadness, and loss in their lives. When you are feeling down, blue, or sad for at least 2 weeks in a row, it may mean that you have depression. Depression can affect your thoughts and feelings, relationships, daily activities, and physical health. It is caused by changes in the way your brain functions. If you receive a diagnosis of depression, your health care provider will tell you which type of depression you have and what treatment options are available to you. If you are living with depression, there are ways to help you recover from it and also ways to prevent it from coming back. How to cope with lifestyle changes Coping with stress     Stress is your body's reaction to life changes and events, both good and bad. Stressful situations may include:  Getting married.  The death of a spouse.  Losing a job.  Retiring.  Having a baby. Stress can last just a few hours or it can be ongoing. Stress can play a major role in depression, so it is important to learn both how to cope with stress and how to think about it differently. Talk with your health care provider or a counselor if you would like to learn more about stress reduction. He or she may suggest some stress reduction techniques, such as:  Music therapy. This can include creating music or listening to music. Choose music that you enjoy and that inspires you.  Mindfulness-based meditation. This kind of meditation can be done while sitting or walking. It involves being aware of your normal breaths, rather than trying to control your breathing.  Centering prayer. This is a kind of meditation that involves focusing on a spiritual word or phrase. Choose a word, phrase, or sacred image that is meaningful to you and that brings you peace.  Deep breathing. To do this, expand your stomach and inhale slowly through your nose. Hold your breath for 3-5 seconds, then exhale  slowly, allowing your stomach muscles to relax.  Muscle relaxation. This involves intentionally tensing muscles then relaxing them. Choose a stress reduction technique that fits your lifestyle and personality. Stress reduction techniques take time and practice to develop. Set aside 5-15 minutes a day to do them. Therapists can offer training in these techniques. The training may be covered by some insurance plans. Other things you can do to manage stress include:  Keeping a stress diary. This can help you learn what triggers your stress and ways to control your response.  Understanding what your limits are and saying no to requests or events that lead to a schedule that is too full.  Thinking about how you respond to certain situations. You may not be able to control everything, but you can control how you react.  Adding humor to your life by watching funny films or TV shows.  Making time for activities that help you relax and not feeling guilty about spending your time this way.  Medicines Your health care provider may suggest certain medicines if he or she feels that they will help improve your condition. Avoid using alcohol and other substances that may prevent your medicines from working properly (may interact). It is also important to:  Talk with your pharmacist or health care provider about all the medicines that you take, their possible side effects, and what medicines are safe to take together.  Make it your goal to take part in all treatment decisions (shared decision-making). This includes giving input on   the side effects of medicines. It is best if shared decision-making with your health care provider is part of your total treatment plan. If your health care provider prescribes a medicine, you may not notice the full benefits of it for 4-8 weeks. Most people who are treated for depression need to be on medicine for at least 6-12 months after they feel better. If you are taking  medicines as part of your treatment, do not stop taking medicines without first talking to your health care provider. You may need to have the medicine slowly decreased (tapered) over time to decrease the risk of harmful side effects. Relationships Your health care provider may suggest family therapy along with individual therapy and drug therapy. While there may not be family problems that are causing you to feel depressed, it is still important to make sure your family learns as much as they can about your mental health. Having your family's support can help make your treatment successful. How to recognize changes in your condition Everyone has a different response to treatment for depression. Recovery from major depression happens when you have not had signs of major depression for two months. This may mean that you will start to:  Have more interest in doing activities.  Feel less hopeless than you did 2 months ago.  Have more energy.  Overeat less often, or have better or improving appetite.  Have better concentration. Your health care provider will work with you to decide the next steps in your recovery. It is also important to recognize when your condition is getting worse. Watch for these signs:  Having fatigue or low energy.  Eating too much or too little.  Sleeping too much or too little.  Feeling restless, agitated, or hopeless.  Having trouble concentrating or making decisions.  Having unexplained physical complaints.  Feeling irritable, angry, or aggressive. Get help as soon as you or your family members notice these symptoms coming back. How to get support and help from others How to talk with friends and family members about your condition  Talking to friends and family members about your condition can provide you with one way to get support and guidance. Reach out to trusted friends or family members, explain your symptoms to them, and let them know that you are  working with a health care provider to treat your depression. Financial resources Not all insurance plans cover mental health care, so it is important to check with your insurance carrier. If paying for co-pays or counseling services is a problem, search for a local or county mental health care center. They may be able to offer public mental health care services at low or no cost when you are not able to see a private health care provider. If you are taking medicine for depression, you may be able to get the generic form, which may be less expensive. Some makers of prescription medicines also offer help to patients who cannot afford the medicines they need. Follow these instructions at home:   Get the right amount and quality of sleep.  Cut down on using caffeine, tobacco, alcohol, and other potentially harmful substances.  Try to exercise, such as walking or lifting small weights.  Take over-the-counter and prescription medicines only as told by your health care provider.  Eat a healthy diet that includes plenty of vegetables, fruits, whole grains, low-fat dairy products, and lean protein. Do not eat a lot of foods that are high in solid fats, added sugars, or salt.    Keep all follow-up visits as told by your health care provider. This is important. Contact a health care provider if:  You stop taking your antidepressant medicines, and you have any of these symptoms: ? Nausea. ? Headache. ? Feeling lightheaded. ? Chills and body aches. ? Not being able to sleep (insomnia).  You or your friends and family think your depression is getting worse. Get help right away if:  You have thoughts of hurting yourself or others. If you ever feel like you may hurt yourself or others, or have thoughts about taking your own life, get help right away. You can go to your nearest emergency department or call:  Your local emergency services (911 in the [place]  A suicide crisis helpline, such as the  National Suicide Prevention Lifeline at 1-800-273-8255. This is open 24-hours a day. Summary  If you are living with depression, there are ways to help you recover from it and also ways to prevent it from coming back.  Work with your health care team to create a management plan that includes counseling, stress management techniques, and healthy lifestyle habits. This information is not intended to replace advice given to you by your health care provider. Make sure you discuss any questions you have with your health care provider. Document Revised: 07/05/2018 Document Reviewed: 02/14/2016 Elsevier Patient Education  2020 Elsevier Inc.  

## 2019-07-07 ENCOUNTER — Encounter: Payer: Self-pay | Admitting: Nurse Practitioner

## 2019-07-07 ENCOUNTER — Telehealth: Payer: Self-pay | Admitting: Nurse Practitioner

## 2019-07-07 NOTE — Telephone Encounter (Signed)
-----   Message from Marjie Skiff, NP sent at 07/03/2019 10:33 AM EDT ----- 6 months follow-up

## 2019-07-07 NOTE — Telephone Encounter (Signed)
Lvm to make this apt. Sent letter.  

## 2019-07-07 NOTE — Telephone Encounter (Signed)
Routing back to [place], per [place]

## 2019-07-11 NOTE — Telephone Encounter (Signed)
Lvm to make this 6 month f.u 

## 2019-07-14 NOTE — Telephone Encounter (Signed)
Lvm to make this 6 month f.u

## 2019-08-15 ENCOUNTER — Telehealth: Payer: Self-pay

## 2019-08-15 NOTE — Telephone Encounter (Signed)
Received a fax from [employer] stating that they have tried to contact the patient several times to schedule, also sent an email with new patient forms on 07/15/19 according to the fax. The patient can contact them at [phone] or [phone]. Please let patient know this information if she calls back.

## 2019-08-18 NOTE — Telephone Encounter (Signed)
Called and LVM asking for patient to please return my call.  

## 2020-03-16 ENCOUNTER — Telehealth: Payer: Self-pay | Admitting: Physician Assistant

## 2020-03-16 ENCOUNTER — Encounter: Payer: Self-pay | Admitting: Physician Assistant

## 2020-03-16 NOTE — Telephone Encounter (Signed)
Called to discuss with patient about Covid symptoms and the use of sotrovimab, bamlanivimab/etesevimab or casirivimab/imdevimab, a monoclonal antibody infusion for those with mild to moderate Covid symptoms and at a high risk of hospitalization.  Pt is qualified for this infusion at the [facility] due to; Specific high risk criteria : BMI > 25 and Other high risk medical condition per CDC:  high SVI   Message left to call back our hotline [phone]. My chart message sent if active on Mychart.   Cline Crock PA-C

## 2020-03-27 NOTE — L&D Delivery Note (Addendum)
Delivery Summary for Mary Bradford  Labor Events:   Preterm labor: No data found  Rupture date: 02/01/2021  Rupture time: 5:30 PM  Rupture type: Artificial  Fluid Color: Clear  Induction: No data found  Augmentation: No data found  Complications: No data found  Cervical ripening: No data found No data found   No data found     Delivery:   Episiotomy: No data found  Lacerations: No data found  Repair suture: No data found  Repair # of packets: No data found  Blood loss (ml): 400 ml   Information for the patient's newborn:  Aliza, Moret [[id number]]   Delivery 02/02/2021 9:16 AM by  Vaginal, Spontaneous Sex:  female Gestational Age: [redacted]w[redacted]d Delivery Clinician:   Living?:         APGARS  One minute Five minutes Ten minutes  Skin color:        Heart rate:        Grimace:        Muscle tone:        Breathing:        Totals: 8  9      Presentation/position:      Resuscitation:   Cord information:    Disposition of cord blood:     Blood gases sent?  Complications:   Placenta: Delivered:       appearance Newborn Measurements: Weight: 8 lb 13.8 oz (4020 g)  Height: 20.87"  Head circumference:    Chest circumference:    Other providers:    Additional  information: Forceps:   Vacuum:   Breech:   Observed anomalies      Delivery Note At 9:16 AM a viable and healthy female was delivered via Vaginal, Spontaneous (Presentation:  Vertex; Left Occiput Anterior).  APGAR: 8, 9; weight 8 lb 13.8 oz (4020 g).   Placenta status: Spontaneous, Intact.  Cord: 3 vessels with the following complications: Nuchal cord x 1, loose. Reducible at perineum.  Cord pH: not obtained.  Delayed cord clamping observed. Moderate terminal meconium present after delivery.  Anesthesia: Epidural Episiotomy: None Lacerations: 2nd degree Suture Repair: 3.0 vicryl rapide Est. Blood Loss (mL):  400 ml.  IV Pitocin administered after delivery of infant. 800 mcg of Cytotec placed rectally  after delivery of placenta prophylactically.   Mom to postpartum.  Baby to Couplet care / Skin to Skin.  Chuong Casebeer,MD 02/02/2021, 9:35 AM

## 2020-06-24 ENCOUNTER — Ambulatory Visit (INDEPENDENT_AMBULATORY_CARE_PROVIDER_SITE_OTHER): Payer: BC Managed Care – PPO | Admitting: Obstetrics and Gynecology

## 2020-06-24 ENCOUNTER — Encounter: Payer: Self-pay | Admitting: Obstetrics and Gynecology

## 2020-06-24 ENCOUNTER — Other Ambulatory Visit: Payer: Self-pay

## 2020-06-24 VITALS — BP 141/95 | HR 101 | Ht 68.0 in | Wt 272.4 lb

## 2020-06-24 DIAGNOSIS — Z98891 History of uterine scar from previous surgery: Secondary | ICD-10-CM

## 2020-06-24 DIAGNOSIS — Z3A08 8 weeks gestation of pregnancy: Secondary | ICD-10-CM

## 2020-06-24 DIAGNOSIS — O09811 Supervision of pregnancy resulting from assisted reproductive technology, first trimester: Secondary | ICD-10-CM | POA: Diagnosis not present

## 2020-06-24 DIAGNOSIS — O99211 Obesity complicating pregnancy, first trimester: Secondary | ICD-10-CM

## 2020-06-24 DIAGNOSIS — Z6841 Body Mass Index (BMI) 40.0 and over, adult: Secondary | ICD-10-CM

## 2020-06-24 DIAGNOSIS — Z7689 Persons encountering health services in other specified circumstances: Secondary | ICD-10-CM

## 2020-06-24 NOTE — Progress Notes (Signed)
HPI:      [name] is a 35 y.o. G3P1011 who LMP was No LMP recorded. Patient is pregnant.  Subjective:   She presents today to establish care.  She conceived with IVF and is approximately 8 weeks estimated gestational age.  Her last pregnancy was also conceived through IVF. (Possible reduced female fertility requiring IVF) Her previous pregnancy ended in cesarean delivery at term for breech.  She describes no other complications during that pregnancy. She has not yet decided on repeat cesarean versus TOLAC.  (She states that the ultrasound tech who did her ultrasound 2 days ago remarked negatively about her internal cesarean scar ??) Patient is currently taking hormonal support with both estrogen and progesterone -to end at 10 weeks. Had some nausea and vomiting in early pregnancy but she says this is resolving.    Hx: The following portions of the patient's history were reviewed and updated as appropriate:             She  has a past medical history of BMI 40.0-44.9, adult (HCC), Depression, History of PCOS, Migraines, Miscarriage, and Newborn product of in vitro fertilization (IVF) pregnancy. She does not have any pertinent problems on file. She  has a past surgical history that includes Wisdom tooth extraction; egg  retrieval; and Cesarean section (N/A, 05/12/2017). Her family history includes Dementia in her maternal grandfather; Diabetes in her father, maternal grandfather, and maternal grandmother; Heart disease in her maternal grandmother; Hodgkin's lymphoma in her paternal grandfather; Hypertension in her father and mother; Irritable bowel syndrome in her sister; Kidney Stones in her father and mother; Kidney disease in her sister; Leukemia in her maternal uncle, paternal grandfather, and sister; Migraines in her father and sister; Non-Hodgkin's lymphoma in her paternal grandfather. She  reports that she has never smoked. She has never used smokeless tobacco. She reports that  she does not drink alcohol and does not use drugs. She has a current medication list which includes the following prescription(s): estradiol, estradiol, and progesterone. She is allergic to amoxicillin.       Review of Systems:  Review of Systems  Constitutional: Denied constitutional symptoms, night sweats, recent illness, fatigue, fever, insomnia and weight loss.  Eyes: Denied eye symptoms, eye pain, photophobia, vision change and visual disturbance.  Ears/Nose/Throat/Neck: Denied ear, nose, throat or neck symptoms, hearing loss, nasal discharge, sinus congestion and sore throat.  Cardiovascular: Denied cardiovascular symptoms, arrhythmia, chest pain/pressure, edema, exercise intolerance, orthopnea and palpitations.  Respiratory: Denied pulmonary symptoms, asthma, pleuritic pain, productive sputum, cough, dyspnea and wheezing.  Gastrointestinal: Denied, gastro-esophageal reflux, melena, nausea and vomiting.  Genitourinary: Denied genitourinary symptoms including symptomatic vaginal discharge, pelvic relaxation issues, and urinary complaints.  Musculoskeletal: Denied musculoskeletal symptoms, stiffness, swelling, muscle weakness and myalgia.  Dermatologic: Denied dermatology symptoms, rash and scar.  Neurologic: Denied neurology symptoms, dizziness, headache, neck pain and syncope.  Psychiatric: Denied psychiatric symptoms, anxiety and depression.  Endocrine: Denied endocrine symptoms including hot flashes and night sweats.   Meds:   Current Outpatient Medications on File Prior to Visit  Medication Sig Dispense Refill  . estradiol (ESTRACE) 2 MG tablet Take 1 tablet by mouth 2 (two) times daily.    Marland Kitchen estradiol (VIVELLE-DOT) 0.1 MG/24HR patch Place onto the skin.    . progesterone 50 MG/ML injection Inject 1.28ml daily     No current facility-administered medications on file prior to visit.          Objective:     Vitals:   06/24/20  0913  BP: (!) 139/92  Pulse: (!) 120    Filed Weights   06/24/20 0913  Weight: 272 lb 6.4 oz (123.6 kg)                Assessment:    G3P1011 Patient Active Problem List   Diagnosis Date Noted  . Depression, major, single episode, mild (HCC) 04/04/2019  . Family history of AML (acute myeloid leukemia) 04/04/2019  . Migraines 03/07/2019  . BMI 40.0-44.9, adult (HCC) 03/07/2019  . Morbid obesity (HCC) 03/07/2019  . S/P primary low transverse C-section 05/12/2017     1. Encounter to establish care   2. History of cesarean delivery   3. Suprvsn of preg rslt from assisted reprodctv tech, first tri   4. Morbid obesity with BMI of 40.0-44.9, adult [facility])       Plan:            Prenatal Plan 1.  The patient was given prenatal literature. 2.  She was continued on prenatal vitamins. 3.  A prenatal lab panel to be drawn at nurse visit. 4.  An ultrasound was ordered to better determine an EDC. 5.  A nurse visit was scheduled. 6.  Genetic testing and testing for other inheritable conditions discussed in detail. She will decide in the future whether to have these labs performed. 7.  A general overview of pregnancy testing, visit schedule, ultrasound schedule, and prenatal care was discussed. 8.  COVID and its risks associated with pregnancy, prevention by limiting exposure and use of masks, as well as the risks and benefits of vaccination during pregnancy were discussed in detail.  [facility] policy regarding office and hospital visitation and testing was explained. 9.  Benefits of breast-feeding discussed in detail including both maternal and infant benefits. Ready Set Baby website discussed. 10. TOLAC discussed-patient will decide definitively later in pregnancy. 11.  ASA to begin at 13 weeks 12.  Discontinue pregnancy hormonal support at 10 weeks   Orders No orders of the defined types were placed in this encounter.   No orders of the defined types were placed in this encounter.     F/U  No follow-ups on file. I  spent 33 minutes involved in the care of this patient preparing to see the patient by obtaining and reviewing her medical history (including labs, imaging tests and prior procedures), documenting clinical information in the electronic health record (EHR), counseling and coordinating care plans, writing and sending prescriptions, ordering tests or procedures and directly communicating with the patient by discussing pertinent items from her history and physical exam as well as detailing my assessment and plan as noted above so that she has an informed understanding.  All of her questions were answered.  Elonda Husky, M.D. 06/24/2020 9:53 AM

## 2020-07-12 ENCOUNTER — Other Ambulatory Visit: Payer: Self-pay

## 2020-07-12 ENCOUNTER — Ambulatory Visit (INDEPENDENT_AMBULATORY_CARE_PROVIDER_SITE_OTHER): Payer: BC Managed Care – PPO | Admitting: Obstetrics and Gynecology

## 2020-07-12 VITALS — BP 118/80 | HR 92 | Ht 68.0 in | Wt 275.6 lb

## 2020-07-12 DIAGNOSIS — Z98891 History of uterine scar from previous surgery: Secondary | ICD-10-CM

## 2020-07-12 DIAGNOSIS — Z6841 Body Mass Index (BMI) 40.0 and over, adult: Secondary | ICD-10-CM

## 2020-07-12 DIAGNOSIS — Z202 Contact with and (suspected) exposure to infections with a predominantly sexual mode of transmission: Secondary | ICD-10-CM

## 2020-07-12 DIAGNOSIS — O09811 Supervision of pregnancy resulting from assisted reproductive technology, first trimester: Secondary | ICD-10-CM | POA: Diagnosis not present

## 2020-07-12 DIAGNOSIS — T7589XA Other specified effects of external causes, initial encounter: Secondary | ICD-10-CM

## 2020-07-12 NOTE — Patient Instructions (Signed)
WHAT OB PATIENTS CAN EXPECT   Confirmation of pregnancy and ultrasound ordered if medically indicated-[redacted] weeks gestation  New OB (NOB) intake with nurse and New OB (NOB) labs- [redacted] weeks gestation  New OB (NOB) physical examination with provider- 11/[redacted] weeks gestation  Flu vaccine-[redacted] weeks gestation  Anatomy scan-[redacted] weeks gestation  Glucose tolerance test, blood work to test for anemia, T-dap vaccine-[redacted] weeks gestation  Vaginal swabs/cultures-STD/Group B strep-[redacted] weeks gestation  Appointments every 4 weeks until 28 weeks  Every 2 weeks from 28 weeks until 36 weeks  Weekly visits from 36 weeks until delivery  Morning Sickness  Morning sickness is when you feel like you may vomit (feel nauseous) during pregnancy. Sometimes, you may vomit. Morning sickness most often happens in the morning, but it can also happen at any time of the day. Some women may have morning sickness that makes them vomit all the time. This is a more serious problem that needs treatment. What are the causes? The cause of this condition is not known. What increases the risk?  You had vomiting or a feeling like you may vomit before your pregnancy.  You had morning sickness in another pregnancy.  You are pregnant with more than one baby, such as twins. What are the signs or symptoms?  Feeling like you may vomit.  Vomiting. How is this treated? Treatment is usually not needed for this condition. You may only need to change what you eat. In some cases, your doctor may give you some things to take for your condition. These include:  Vitamin B6 supplements.  Medicines to treat the feeling that you may vomit.  Ginger. Follow these instructions at home: Medicines  Take over-the-counter and prescription medicines only as told by your doctor. Do not take any medicines until you talk with your doctor about them first.  Take multivitamins before you get pregnant. These can stop or lessen the symptoms of morning  sickness. Eating and drinking  Eat dry toast or crackers before getting out of bed.  Eat 5 or 6 small meals a day.  Eat dry and bland foods like rice and baked potatoes.  Do not eat greasy, fatty, or spicy foods.  Have someone cook for you if the smell of food causes you to vomit or to feel like you may vomit.  If you feel like you may vomit after taking prenatal vitamins, take them at night or with a snack.  Eat protein foods when you need a snack. Nuts, yogurt, and cheese are good choices.  Drink fluids throughout the day.  Try ginger ale made with real ginger, ginger tea made from fresh grated ginger, or ginger candies. General instructions  Do not smoke or use any products that contain nicotine or tobacco. If you need help quitting, ask your doctor.  Use an air purifier to keep the air in your house free of smells.  Get lots of fresh air.  Try to avoid smells that make you feel sick.  Try wearing an acupressure wristband. This is a wristband that is used to treat seasickness.  Try a treatment called acupuncture. In this treatment, a doctor puts needles into certain areas of your body to make you feel better. Contact a doctor if:  You need medicine to feel better.  You feel dizzy or light-headed.  You are losing weight. Get help right away if:  The feeling that you may vomit will not go away, or you cannot stop vomiting.  You faint.  You have  very bad pain in your belly. Summary  Morning sickness is when you feel like you may vomit (feel nauseous) during pregnancy.  You may feel sick in the morning, but you can feel this way at any time of the day.  Making some changes to what you eat may help your symptoms go away. This information is not intended to replace advice given to you by your health care provider. Make sure you discuss any questions you have with your health care provider. Document Revised: 10/27/2019 Document Reviewed: 10/06/2019 Elsevier Patient  Education  2021 [employer]. How a Baby Grows During Pregnancy Pregnancy begins when a female's sperm enters a female's egg. This is called fertilization. Fertilization usually happens in one of the fallopian tubes that connect the ovaries to the uterus. The fertilized egg moves down the fallopian tube to the uterus. Once it reaches the uterus, it implants into the lining of the uterus and begins to grow. For the first 8 weeks, the fertilized egg is called an embryo. After 8 weeks, it is called a fetus. As the fetus continues to grow, it receives oxygen and nutrients through the placenta, which is an organ that grows to support the developing baby. The placenta is the life support system for the baby. It provides oxygen and nutrition and removes waste. How long does a typical pregnancy last? A pregnancy usually lasts 280 days, or about 40 weeks. Pregnancy is divided into three periods of growth, also called trimesters:  First trimester: 0-12 weeks.  Second trimester: 13-27 weeks.  Third trimester: 28-40 weeks. The day when your baby is ready to be born (full term) is your estimated date of delivery. However, most babies are not born on their estimated date of delivery. How does my baby develop month by month? First month  The fertilized egg attaches to the inside of the uterus.  Some cells will form the placenta. Others will form the fetus.  The arms, legs, brain, spinal cord, lungs, and heart begin to develop.  At the end of the first month, the heart begins to beat. Second month  The bones, inner ear, eyelids, hands, and feet form.  The genitals develop.  By the end of 8 weeks, all major organs are developing. Third month  All of the internal organs are forming.  Teeth develop below the gums.  Bones and muscles begin to grow. The spine can flex.  The skin is transparent.  Fingernails and toenails begin to form.  Arms and legs continue to grow longer, and hands and feet  develop.  The fetus is about 3 inches (7.6 cm) long. Fourth month  The placenta is completely formed.  The external sex organs, neck, outer ear, eyebrows, eyelids, and fingernails are formed.  The fetus can hear, swallow, and move its arms and legs.  The kidneys begin to produce urine.  The skin is covered with a white, waxy coating (vernix) and very fine hair (lanugo). Fifth month  The fetus moves around more and can be felt for the first time (quickening).  The fetus starts to sleep and wake up and may begin to suck a finger.  The nails grow to the end of the fingers.  The organ in the digestive system that makes bile (gallbladder) functions and helps to digest nutrients.  If the fetus is a female, eggs are present in the ovaries. If the fetus is a female, testicles start to move down into the scrotum. Sixth month  The lungs are formed.  The eyes open. The brain continues to develop.  Your baby has fingerprints and toe prints. Your baby's hair grows thicker.  At the end of the second trimester, the fetus is about 9 inches (22.9 cm) long. Seventh month  The fetus kicks and stretches.  The eyes are developed enough to sense changes in light.  The hands can make a grasping motion.  The fetus responds to sound. Eighth month  Most organs and body systems are fully developed and functioning.  Bones harden, and taste buds develop. The fetus may hiccup.  Certain areas of the brain are still developing. The skull remains soft. Ninth month  The fetus gains about  lb (0.23 kg) each week.  The lungs are fully developed.  Patterns of sleep develop.  The fetus's head typically moves into a head-down position (vertex) in the uterus to prepare for birth.  The fetus weighs 6-9 lb (2.72-4.08 kg) and is 19-20 inches (48.26-50.8 cm) long.   How do I know if my baby is developing well? Always talk with your health care provider about any concerns that you may have about your  pregnancy and your baby. At each prenatal visit, your health care provider will do several different tests to check on your health and keep track of your baby's development. These include:  Fundal height and position. To do this, your health care provider will: ? Measure your growing belly from your pubic bone to the top of the uterus using a tape measure. ? Feel your belly to determine your baby's position.  Heartbeat. An ultrasound in the first trimester can confirm pregnancy and show a heartbeat, depending on how far along you are. Your health care provider will check your baby's heart rate at every prenatal visit. You will also have a second trimester ultrasound to check your baby's development. Follow these instructions at home:  Take prenatal vitamins as told by your health care provider. These include vitamins such as folic acid, iron, calcium, and vitamin D. They are important for healthy development.  Take over-the-counter and prescription medicines only as told by your health care provider.  Keep all follow-up visits. This is important. Follow-up visits include prenatal care and screening tests. Summary  A pregnancy usually lasts 280 days, or about 40 weeks. Pregnancy is divided into three periods of growth, also called trimesters.  Your health care provider will monitor your baby's growth and development throughout your pregnancy.  Follow your health care provider's recommendations about taking prenatal vitamins and medicines during your pregnancy.  Talk with your health care provider if you have any concerns about your pregnancy or your developing baby. This information is not intended to replace advice given to you by your health care provider. Make sure you discuss any questions you have with your health care provider. Document Revised: 08/20/2019 Document Reviewed: 06/26/2019 Elsevier Patient Education  [place]. Obstetrics: Normal and Problem Pregnancies (7th ed.,  pp. 102-121). [place], [place]: Elsevier."> Textbook of Family Medicine (9th ed., pp. [id number]). [place], [place]: Elsevier [employer].">  First Trimester of Pregnancy  The first trimester of pregnancy starts on the first day of your last menstrual period until the end of week 12. This is months 1 through 3 of pregnancy. A week after a sperm fertilizes an egg, the egg will implant into the wall of the uterus and begin to develop into a baby. By the end of 12 weeks, all the baby's organs will be formed and the baby will be 2-3 inches in  size. Body changes during your first trimester Your body goes through many changes during pregnancy. The changes vary and generally return to normal after your baby is born. Physical changes  You may gain or lose weight.  Your breasts may begin to grow larger and become tender. The tissue that surrounds your nipples (areola) may become darker.  Dark spots or blotches (chloasma or mask of pregnancy) may develop on your face.  You may have changes in your hair. These can include thickening or thinning of your hair or changes in texture. Health changes  You may feel nauseous, and you may vomit.  You may have heartburn.  You may develop headaches.  You may develop constipation.  Your gums may bleed and may be sensitive to brushing and flossing. Other changes  You may tire easily.  You may urinate more often.  Your menstrual periods will stop.  You may have a loss of appetite.  You may develop cravings for certain kinds of food.  You may have changes in your emotions from day to day.  You may have more vivid and strange dreams. Follow these instructions at home: Medicines  Follow your health care provider's instructions regarding medicine use. Specific medicines may be either safe or unsafe to take during pregnancy. Do not take any medicines unless told to by your health care provider.  Take a prenatal vitamin that contains at least 600 micrograms  (mcg) of folic acid. Eating and drinking  Eat a healthy diet that includes fresh fruits and vegetables, whole grains, good sources of protein such as meat, eggs, or tofu, and low-fat dairy products.  Avoid raw meat and unpasteurized juice, milk, and cheese. These carry germs that can harm you and your baby.  If you feel nauseous or you vomit: ? Eat 4 or 5 small meals a day instead of 3 large meals. ? Try eating a few soda crackers. ? Drink liquids between meals instead of during meals.  You may need to take these actions to prevent or treat constipation: ? Drink enough fluid to keep your urine pale yellow. ? Eat foods that are high in fiber, such as beans, whole grains, and fresh fruits and vegetables. ? Limit foods that are high in fat and processed sugars, such as fried or sweet foods. Activity  Exercise only as directed by your health care provider. Most people can continue their usual exercise routine during pregnancy. Try to exercise for 30 minutes at least 5 days a week.  Stop exercising if you develop pain or cramping in the lower abdomen or lower back.  Avoid exercising if it is very hot or humid or if you are at high altitude.  Avoid heavy lifting.  If you choose to, you may have sex unless your health care provider tells you not to. Relieving pain and discomfort  Wear a good support bra to relieve breast tenderness.  Rest with your legs elevated if you have leg cramps or low back pain.  If you develop bulging veins (varicose veins) in your legs: ? Wear support hose as told by your health care provider. ? Elevate your feet for 15 minutes, 3-4 times a day. ? Limit salt in your diet. Safety  Wear your seat belt at all times when driving or riding in a car.  Talk with your health care provider if someone is verbally or physically abusive to you.  Talk with your health care provider if you are feeling sad or have thoughts of hurting yourself.  Lifestyle  Do not use  hot tubs, steam rooms, or saunas.  Do not douche. Do not use tampons or scented sanitary pads.  Do not use herbal remedies, alcohol, illegal drugs, or medicines that are not approved by your health care provider. Chemicals in these products can harm your baby.  Do not use any products that contain nicotine or tobacco, such as cigarettes, e-cigarettes, and chewing tobacco. If you need help quitting, ask your health care provider.  Avoid cat litter boxes and soil used by cats. These carry germs that can cause birth defects in the baby and possibly loss of the unborn baby (fetus) by miscarriage or stillbirth. General instructions  During routine prenatal visits in the first trimester, your health care provider will do a physical exam, perform necessary tests, and ask you how things are going. Keep all follow-up visits. This is important.  Ask for help if you have counseling or nutritional needs during pregnancy. Your health care provider can offer advice or refer you to specialists for help with various needs.  Schedule a dentist appointment. At home, brush your teeth with a soft toothbrush. Floss gently.  Write down your questions. Take them to your prenatal visits. Where to find more information  American Pregnancy Association: americanpregnancy.[place] and Gynecologists: PoolDevices.com.pt  Office on [employer]: KeywordPortfolios.com.br Contact a health care provider if you have:  Dizziness.  A fever.  Mild pelvic cramps, pelvic pressure, or nagging pain in the abdominal area.  Nausea, vomiting, or diarrhea that lasts for 24 hours or longer.  A bad-smelling vaginal discharge.  Pain when you urinate.  Known exposure to a contagious illness, such as chickenpox, measles, Zika virus, HIV, or hepatitis. Get help right away if you have:  Spotting or bleeding from your vagina.  Severe abdominal cramping or pain.  Shortness  of breath or chest pain.  Any kind of trauma, such as from a fall or a car crash.  New or increased pain, swelling, or redness in an arm or leg. Summary  The first trimester of pregnancy starts on the first day of your last menstrual period until the end of week 12 (months 1 through 3).  Eating 4 or 5 small meals a day rather than 3 large meals may help to relieve nausea and vomiting.  Do not use any products that contain nicotine or tobacco, such as cigarettes, e-cigarettes, and chewing tobacco. If you need help quitting, ask your health care provider.  Keep all follow-up visits. This is important. This information is not intended to replace advice given to you by your health care provider. Make sure you discuss any questions you have with your health care provider. Document Revised: 08/20/2019 Document Reviewed: 06/26/2019 Elsevier Patient Education  2021 [employer]. Commonly Asked Questions During Pregnancy  Cats: A parasite can be excreted in cat feces.  To avoid exposure you need to have another person empty the little box.  If you must empty the litter box you will need to wear gloves.  Wash your hands after handling your cat.  This parasite can also be found in raw or undercooked meat so this should also be avoided.  Colds, Sore Throats, Flu: Please check your medication sheet to see what you can take for symptoms.  If your symptoms are unrelieved by these medications please call the office.  Dental Work: Most any dental work Investment banker, corporate recommends is permitted.  X-rays should only be taken during the first trimester if absolutely  necessary.  Your abdomen should be shielded with a lead apron during all x-rays.  Please notify your provider prior to receiving any x-rays.  Novocaine is fine; gas is not recommended.  If your dentist requires a note from [place] prior to dental work please call the office and we will provide one for you.  Exercise: Exercise is an important part of staying  healthy during your pregnancy.  You may continue most exercises you were accustomed to prior to pregnancy.  Later in your pregnancy you will most likely notice you have difficulty with activities requiring balance like riding a bicycle.  It is important that you listen to your body and avoid activities that put you at a higher risk of falling.  Adequate rest and staying well hydrated are a must!  If you have questions about the safety of specific activities ask your provider.    Exposure to Children with illness: Try to avoid obvious exposure; report any symptoms to [place] when noted,  If you have chicken pos, red measles or mumps, you should be immune to these diseases.   Please do not take any vaccines while pregnant unless you have checked with your OB provider.  Fetal Movement: After 28 weeks we recommend you do "kick counts" twice daily.  Lie or sit down in a calm quiet environment and count your baby movements "kicks".  You should feel your baby at least 10 times per hour.  If you have not felt 10 kicks within the first hour get up, walk around and have something sweet to eat or drink then repeat for an additional hour.  If count remains less than 10 per hour notify your provider.  Fumigating: Follow your pest control agent's advice as to how long to stay out of your home.  Ventilate the area well before re-entering.  Hemorrhoids:   Most over-the-counter preparations can be used during pregnancy.  Check your medication to see what is safe to use.  It is important to use a stool softener or fiber in your diet and to drink lots of liquids.  If hemorrhoids seem to be getting worse please call the office.   Hot Tubs:  Hot tubs Jacuzzis and saunas are not recommended while pregnant.  These increase your internal body temperature and should be avoided.  Intercourse:  Sexual intercourse is safe during pregnancy as long as you are comfortable, unless otherwise advised by your provider.  Spotting may occur  after intercourse; report any bright red bleeding that is heavier than spotting.  Labor:  If you know that you are in labor, please go to the hospital.  If you are unsure, please call the office and let us help you decide what to do.  Lifting, straining, etc:  If your job requires heavy lifting or straining please check with your provider for any limitations.  Generally, you should not lift items heavier than that you can lift simply with your hands and arms (no back muscles)  Painting:  Paint fumes do not harm your pregnancy, but may make you ill and should be avoided if possible.  Latex or water based paints have less odor than oils.  Use adequate ventilation while painting.  Permanents & Hair Color:  Chemicals in hair dyes are not recommended as they cause increase hair dryness which can increase hair loss during pregnancy.  " Highlighting" and permanents are allowed.  Dye may be absorbed differently and permanents may not hold as well during pregnancy.  Sunbathing:  Use  a sunscreen, as skin burns easily during pregnancy.  Drink plenty of fluids; avoid over heating.  Tanning Beds:  Because their possible side effects are still unknown, tanning beds are not recommended.  Ultrasound Scans:  Routine ultrasounds are performed at approximately 20 weeks.  You will be able to see your baby's general anatomy an if you would like to know the gender this can usually be determined as well.  If it is questionable when you conceived you may also receive an ultrasound early in your pregnancy for dating purposes.  Otherwise ultrasound exams are not routinely performed unless there is a medical necessity.  Although you can request a scan we ask that you pay for it when conducted because insurance does not cover " patient request" scans.  Work: If your pregnancy proceeds without complications you may work until your due date, unless your physician or employer advises otherwise.  Round Ligament Pain/Pelvic  Discomfort:  Sharp, shooting pains not associated with bleeding are fairly common, usually occurring in the second trimester of pregnancy.  They tend to be worse when standing up or when you remain standing for long periods of time.  These are the result of pressure of certain pelvic ligaments called "round ligaments".  Rest, Tylenol and heat seem to be the most effective relief.  As the womb and fetus grow, they rise out of the pelvis and the discomfort improves.  Please notify the office if your pain seems different than that described.  It may represent a more serious condition.  Common Medications Safe in Pregnancy  Acne:      Constipation:  Benzoyl Peroxide     Colace  Clindamycin      Dulcolax Suppository  Topica Erythromycin     Fibercon  Salicylic Acid      Metamucil         Miralax AVOID:        Senakot   Accutane    Cough:  Retin-A       Cough Drops  Tetracycline      Phenergan w/ Codeine if Rx  Minocycline      Robitussin (Plain & DM)  Antibiotics:     Crabs/Lice:  Ceclor       RID  Cephalosporins    AVOID:  E-Mycins      Kwell  Keflex  Macrobid/Macrodantin   Diarrhea:  Penicillin      Kao-Pectate  Zithromax      Imodium AD         PUSH FLUIDS AVOID:       Cipro     Fever:  Tetracycline      Tylenol (Regular or Extra  Minocycline       Strength)  Levaquin      Extra Strength-Do not          Exceed 8 tabs/24 hrs Caffeine:        <274m/day (equiv. To 1 cup of coffee or  approx. 3 12 oz sodas)         Gas: Cold/Hayfever:       Gas-X  Benadryl      Mylicon  Claritin       Phazyme  **Claritin-D        Chlor-Trimeton    Headaches:  Dimetapp      ASA-Free Excedrin  Drixoral-Non-Drowsy     Cold Compress  Mucinex (Guaifenasin)     Tylenol (Regular or Extra  Sudafed/Sudafed-12 Hour     Strength)  **Sudafed PE Pseudoephedrine  Tylenol Cold & Sinus     Vicks Vapor Rub  Zyrtec  **AVOID if Problems With Blood Pressure         Heartburn: Avoid lying down for at  least 1 hour after meals  Aciphex      Maalox     Rash:  Milk of Magnesia     Benadryl    Mylanta       1% Hydrocortisone Cream  Pepcid  Pepcid Complete   Sleep Aids:  Prevacid      Ambien   Prilosec       Benadryl  Rolaids       Chamomile Tea  Tums (Limit 4/day)     Unisom         Tylenol PM         Warm milk-add vanilla or  Hemorrhoids:       Sugar for taste  Anusol/Anusol H.C.  (RX: Analapram 2.5%)  Sugar Substitutes:  Hydrocortisone OTC     Ok in moderation  Preparation H      Tucks        Vaseline lotion applied to tissue with wiping    Herpes:     Throat:  Acyclovir      Oragel  Famvir  Valtrex     Vaccines:         Flu Shot Leg Cramps:       *Gardasil  Benadryl      Hepatitis A         Hepatitis B Nasal Spray:       Pneumovax  Saline Nasal Spray     Polio Booster         Tetanus Nausea:       Tuberculosis test or PPD  Vitamin B6 25 mg TID   AVOID:    Dramamine      *Gardasil  Emetrol       Live Poliovirus  Ginger Root 250 mg QID    MMR (measles, mumps &  High Complex Carbs @ Bedtime    rebella)  Sea Bands-Accupressure    Varicella (Chickenpox)  Unisom 1/2 tab TID     *No known complications           If received before Pain:         Known pregnancy;   Darvocet       Resume series after  Lortab        Delivery  Percocet    Yeast:   Tramadol      Femstat  Tylenol 3      Gyne-lotrimin  Ultram       Monistat  Vicodin           MISC:         All Sunscreens           Hair Coloring/highlights          Insect Repellant's          (Including DEET)         [facility]  [place], [place], [place] [address]  Phone: [phone]   [facility] (second location)  [address] [place], [place] [address]  Phone: [phone]   [facility]) [place], [place], [place] [address] Phone: [phone]   [facility] [address] [place],   [address]  Phone: [phone]

## 2020-07-12 NOTE — Progress Notes (Signed)
Samson Frederic presents for NOB nurse interview visit. Pregnancy confirmation done [facility] IVF 06/22/20. G-3 .  P-1011.  Dating scan at [facility] on 06/22/2020 shows SIUP at 7 5/7 with EDD of  02/03/2021. Pregnancy education material explained and given. _Pos for  cats in the home.  Toxo ordered. Pt aware to not change litter box. NOB labs ordered. TSH/HbgA1c due to Increased BMI- Body mass index is 41.9 kg/m.  HIV labs and Drug screen were explained and  ordered.  PNV encouraged. Genetic screening options discussed. Genetic testing: to be completed at NOB [facility].   Pt may discuss with provider. Last pap 2019 neg. + for Covid vaccines. Mild nausea- no meds needed. Pt requested progesterone lab to be drawn today per her IVF Dr. 4/14 last estrogen and progesterone meds taken.  FMLA form explained and signed. HIV and Drug screen consent explained and signed.  Pt. To follow up with provider in _2 _ weeks for NOB physical.  All questions answered.

## 2020-07-13 LAB — MONITOR DRUG PROFILE 14(MW)
Amphetamine Scrn, Ur: NEGATIVE ng/mL
BARBITURATE SCREEN URINE: NEGATIVE ng/mL
BENZODIAZEPINE SCREEN, URINE: NEGATIVE ng/mL
Buprenorphine, Urine: NEGATIVE ng/mL
CANNABINOIDS UR QL SCN: NEGATIVE ng/mL
Cocaine (Metab) Scrn, Ur: NEGATIVE ng/mL
Creatinine(Crt), U: 146 mg/dL (ref 20.0–300.0)
Fentanyl, Urine: NEGATIVE pg/mL
Meperidine Screen, Urine: NEGATIVE ng/mL
Methadone Screen, Urine: NEGATIVE ng/mL
OXYCODONE+OXYMORPHONE UR QL SCN: NEGATIVE ng/mL
Opiate Scrn, Ur: NEGATIVE ng/mL
Ph of Urine: 6.5 (ref 4.5–8.9)
Phencyclidine Qn, Ur: NEGATIVE ng/mL
Propoxyphene Scrn, Ur: NEGATIVE ng/mL
SPECIFIC GRAVITY: 1.016
Tramadol Screen, Urine: NEGATIVE ng/mL

## 2020-07-13 LAB — MICROSCOPIC EXAMINATION
Casts: NONE SEEN /lpf
Epithelial Cells (non renal): >10 /hpf — AB (ref 0–10)
RBC: NONE SEEN /hpf (ref 0–2)

## 2020-07-13 LAB — HEMOGLOBIN A1C
Est. average glucose Bld gHb Est-mCnc: 111 mg/dL
Hgb A1c MFr Bld: 5.5 % (ref 4.8–5.6)

## 2020-07-13 LAB — RPR: RPR Ser Ql: NONREACTIVE

## 2020-07-13 LAB — HIV ANTIBODY (ROUTINE TESTING W REFLEX): HIV Screen 4th Generation wRfx: NONREACTIVE

## 2020-07-13 LAB — VIRAL HEPATITIS HBV, HCV
HCV Ab: <0.1 s/co ratio (ref 0.0–0.9)
Hep B Core Total Ab: NEGATIVE
Hep B Surface Ab, Qual: REACTIVE
Hepatitis B Surface Ag: NEGATIVE

## 2020-07-13 LAB — URINALYSIS, ROUTINE W REFLEX MICROSCOPIC
Bilirubin, UA: NEGATIVE
Glucose, UA: NEGATIVE
Ketones, UA: NEGATIVE
Nitrite, UA: NEGATIVE
RBC, UA: NEGATIVE
Specific Gravity, UA: 1.02 (ref 1.005–1.030)
Urobilinogen, Ur: 0.2 mg/dL (ref 0.2–1.0)
pH, UA: 6.5 (ref 5.0–7.5)

## 2020-07-13 LAB — ABO AND RH: Rh Factor: NEGATIVE

## 2020-07-13 LAB — GC/CHLAMYDIA PROBE AMP
Chlamydia trachomatis, NAA: NEGATIVE
Neisseria Gonorrhoeae by PCR: NEGATIVE

## 2020-07-13 LAB — TSH: TSH: 0.268 u[IU]/mL — ABNORMAL LOW (ref 0.450–4.500)

## 2020-07-13 LAB — TOXOPLASMA ANTIBODIES- IGG AND  IGM
Toxoplasma Antibody- IgM: <3 AU/mL (ref 0.0–7.9)
Toxoplasma IgG Ratio: <3 IU/mL (ref 0.0–7.1)

## 2020-07-13 LAB — VARICELLA ZOSTER ANTIBODY, IGG: Varicella zoster IgG: 2135 index (ref >165)

## 2020-07-13 LAB — RUBELLA SCREEN: Rubella Antibodies, IGG: 2.93 index (ref >0.99)

## 2020-07-13 LAB — NICOTINE SCREEN, URINE: Cotinine Ql Scrn, Ur: NEGATIVE ng/mL

## 2020-07-13 LAB — HCV INTERPRETATION

## 2020-07-13 LAB — ANTIBODY SCREEN: Antibody Screen: NEGATIVE

## 2020-07-15 LAB — PROGESTERONE: Progesterone: 8.8 ng/mL

## 2020-07-15 LAB — SPECIMEN STATUS REPORT

## 2020-07-16 LAB — URINE CULTURE, OB REFLEX

## 2020-07-16 LAB — CULTURE, OB URINE

## 2020-07-20 ENCOUNTER — Other Ambulatory Visit: Payer: Self-pay | Admitting: Surgical

## 2020-07-20 MED ORDER — NITROFURANTOIN MONOHYD MACRO 100 MG PO CAPS: 100.0000 mg | ORAL_CAPSULE | Freq: Two times a day (BID) | ORAL | 0 refills | Status: DC

## 2020-07-29 ENCOUNTER — Encounter: Payer: Self-pay | Admitting: Obstetrics and Gynecology

## 2020-07-29 ENCOUNTER — Ambulatory Visit (INDEPENDENT_AMBULATORY_CARE_PROVIDER_SITE_OTHER): Payer: BC Managed Care – PPO | Admitting: Obstetrics and Gynecology

## 2020-07-29 ENCOUNTER — Other Ambulatory Visit (HOSPITAL_COMMUNITY)
Admission: RE | Admit: 2020-07-29 | Discharge: 2020-07-29 | Disposition: A | Discharge status: Home / Self Care | Payer: BC Managed Care – PPO | Source: Ambulatory Visit | Attending: Obstetrics and Gynecology | Admitting: Obstetrics and Gynecology

## 2020-07-29 ENCOUNTER — Other Ambulatory Visit: Payer: Self-pay

## 2020-07-29 ENCOUNTER — Other Ambulatory Visit: Payer: Self-pay | Admitting: Nurse Practitioner

## 2020-07-29 VITALS — BP 129/84 | HR 102 | Wt 273.0 lb

## 2020-07-29 DIAGNOSIS — Z3491 Encounter for supervision of normal pregnancy, unspecified, first trimester: Secondary | ICD-10-CM

## 2020-07-29 DIAGNOSIS — Z124 Encounter for screening for malignant neoplasm of cervix: Secondary | ICD-10-CM

## 2020-07-29 DIAGNOSIS — Z3A13 13 weeks gestation of pregnancy: Secondary | ICD-10-CM

## 2020-07-29 NOTE — Progress Notes (Signed)
NOB: Patient states she is "unable to relax".  She is very concerned regarding this premium IVF pregnancy.  She states that her endocrinologist progesterone levels and they were much lower than expected-they have advised her to restart progesterone injections.  Found to have decreased TSH- thyroid panel drawn.  Patient had GBS bacteriuria-finished her course of antibiotics.  GBS discussed.  Reassured patient regarding likelihood of continued successful pregnancy.  Pap performed today, AFP next visit  Physical examination General NAD, Conversant  HEENT Atraumatic; Op clear with mmm.  Normo-cephalic. Pupils reactive. Anicteric sclerae  Thyroid/Neck Smooth without nodularity or enlargement. Normal ROM.  Neck Supple.  Skin No rashes, lesions or ulceration. Normal palpated skin turgor. No nodularity.  Breasts: No masses or discharge.  Symmetric.  No axillary adenopathy.  Lungs: Clear to auscultation.No rales or wheezes. Normal Respiratory effort, no retractions.  Heart: NSR.  No murmurs or rubs appreciated. No periferal edema  Abdomen: Soft.  Non-tender.  No masses.  No HSM. No hernia  Extremities: Moves all appropriately.  Normal ROM for age. No lymphadenopathy.  Neuro: Oriented to PPT.  Normal mood. Normal affect.     Pelvic:   Vulva: Normal appearance.  No lesions.  Vagina: No lesions or abnormalities noted.  Support: Normal pelvic support.  Urethra No masses tenderness or scarring.  Meatus Normal size without lesions or prolapse.  Cervix: Normal appearance.  No lesions.  Anus: Normal exam.  No lesions.  Perineum: Normal exam.  No lesions.        Bimanual   Adnexae: No masses.  Non-tender to palpation.  Uterus: Enlarged. POS FHTs  Non-tender.  Mobile.  AV.  Adnexae: No masses.  Non-tender to palpation.  Cul-de-sac: Negative for abnormality.  Adnexae: No masses.  Non-tender to palpation.         Pelvimetry   Diagonal: Reached.  Spines: Average.  Sacrum: Concave.  Pubic Arch: Normal.

## 2020-07-29 NOTE — Addendum Note (Signed)
Addended by: Dorian Pod on: 07/29/2020 10:24 AM   Modules accepted: Orders

## 2020-07-30 LAB — THYROID PANEL WITH TSH
Free Thyroxine Index: 1.7 (ref 1.2–4.9)
T3 Uptake Ratio: 14 % — ABNORMAL LOW (ref 24–39)
T4, Total: 12.3 ug/dL — ABNORMAL HIGH (ref 4.5–12.0)
TSH: 0.248 u[IU]/mL — ABNORMAL LOW (ref 0.450–4.500)

## 2020-08-02 LAB — MATERNIT21  PLUS CORE+ESS+SCA, BLOOD

## 2020-08-03 LAB — CYTOLOGY - PAP
Comment: NEGATIVE
Diagnosis: NEGATIVE
High risk HPV: NEGATIVE

## 2020-08-06 ENCOUNTER — Other Ambulatory Visit: Payer: Self-pay | Admitting: Surgical

## 2020-08-06 DIAGNOSIS — E0590 Thyrotoxicosis, unspecified without thyrotoxic crisis or storm: Secondary | ICD-10-CM

## 2020-08-26 ENCOUNTER — Ambulatory Visit (INDEPENDENT_AMBULATORY_CARE_PROVIDER_SITE_OTHER): Payer: BC Managed Care – PPO | Admitting: Obstetrics and Gynecology

## 2020-08-26 ENCOUNTER — Encounter: Payer: BC Managed Care – PPO | Admitting: Obstetrics and Gynecology

## 2020-08-26 ENCOUNTER — Other Ambulatory Visit: Payer: Self-pay

## 2020-08-26 ENCOUNTER — Encounter: Payer: Self-pay | Admitting: Obstetrics and Gynecology

## 2020-08-26 VITALS — BP 135/89 | HR 108 | Wt 275.0 lb

## 2020-08-26 DIAGNOSIS — O09899 Supervision of other high risk pregnancies, unspecified trimester: Secondary | ICD-10-CM | POA: Insufficient documentation

## 2020-08-26 DIAGNOSIS — Z87440 Personal history of urinary (tract) infections: Secondary | ICD-10-CM

## 2020-08-26 DIAGNOSIS — O09522 Supervision of elderly multigravida, second trimester: Secondary | ICD-10-CM

## 2020-08-26 DIAGNOSIS — Z98891 History of uterine scar from previous surgery: Secondary | ICD-10-CM

## 2020-08-26 DIAGNOSIS — Z6791 Unspecified blood type, Rh negative: Secondary | ICD-10-CM

## 2020-08-26 DIAGNOSIS — O99210 Obesity complicating pregnancy, unspecified trimester: Secondary | ICD-10-CM

## 2020-08-26 DIAGNOSIS — Z3A17 17 weeks gestation of pregnancy: Secondary | ICD-10-CM

## 2020-08-26 DIAGNOSIS — R7989 Other specified abnormal findings of blood chemistry: Secondary | ICD-10-CM

## 2020-08-26 DIAGNOSIS — O26899 Other specified pregnancy related conditions, unspecified trimester: Secondary | ICD-10-CM

## 2020-08-26 DIAGNOSIS — Z8659 Personal history of other mental and behavioral disorders: Secondary | ICD-10-CM

## 2020-08-26 DIAGNOSIS — Z8669 Personal history of other diseases of the nervous system and sense organs: Secondary | ICD-10-CM

## 2020-08-26 DIAGNOSIS — Z1379 Encounter for other screening for genetic and chromosomal anomalies: Secondary | ICD-10-CM

## 2020-08-26 LAB — POCT URINALYSIS DIPSTICK OB
Bilirubin, UA: NEGATIVE
Blood, UA: NEGATIVE
Glucose, UA: NEGATIVE
Ketones, UA: NEGATIVE
Nitrite, UA: NEGATIVE
POC,PROTEIN,UA: NEGATIVE
Spec Grav, UA: <=1.005 — AB (ref 1.010–1.025)
Urobilinogen, UA: 0.2 E.U./dL
pH, UA: 6 (ref 5.0–8.0)

## 2020-08-26 NOTE — Progress Notes (Signed)
ROB: patient denies major complaints today. H/o C/S x 1, briefly discussed TOLAC vs repeat C-section, notes she is considering TOLAC but has not decided.  Baseline PHQ-9 done for depression.  No issues with her migraines. Discussed repeating urine culture due to GBS UTI.  Will also repeat thyroid studies as last levels elevated in first trimester. Notes she recently stopped taking her progesterone several last week, had her levels checked by [facility] yesterday and plan to repeat tomorrow to reassess. Is taking aspirin.   AFP ordered today. Notes migraines have improved in pregnancy. RTC in 4 weeks. Is scheduled for anatomy scan.

## 2020-08-26 NOTE — Progress Notes (Signed)
OB-pt present for routine prenatal care. Pt stated she was doing well. PHQ-9=2 GAD-7=4.

## 2020-08-26 NOTE — Patient Instructions (Signed)
Second Trimester of Pregnancy  The second trimester of pregnancy is from week 13 through week 27. This is months 4 through 6 of pregnancy. The second trimester is often a time when you feel your best. Your body has adjusted to being pregnant, and you begin to feel better physically. During the second trimester:  Morning sickness has lessened or stopped completely.  You may have more energy.  You may have an increase in appetite. The second trimester is also a time when the unborn baby (fetus) is growing rapidly. At the end of the sixth month, the fetus may be up to 12 inches long and weigh about 1 pounds. You will likely begin to feel the baby move (quickening) between 16 and 20 weeks of pregnancy. Body changes during your second trimester Your body continues to go through many changes during your second trimester. The changes vary and generally return to normal after the baby is born. Physical changes  Your weight will continue to increase. You will notice your lower abdomen bulging out.  You may begin to get stretch marks on your hips, abdomen, and breasts.  Your breasts will continue to grow and to become tender.  Dark spots or blotches (chloasma or mask of pregnancy) may develop on your face.  A dark line from your belly button to the pubic area (linea nigra) may appear.  You may have changes in your hair. These can include thickening of your hair, rapid growth, and changes in texture. Some people also have hair loss during or after pregnancy, or hair that feels dry or thin. Health changes  You may develop headaches.  You may have heartburn.  You may develop constipation.  You may develop hemorrhoids or swollen, bulging veins (varicose veins).  Your gums may bleed and may be sensitive to brushing and flossing.  You may urinate more often because the fetus is pressing on your bladder.  You may have back pain. This is caused by: ? Weight gain. ? Pregnancy hormones that  are relaxing the joints in your pelvis. ? A shift in weight and the muscles that support your balance. Follow these instructions at home: Medicines  Follow your health care provider's instructions regarding medicine use. Specific medicines may be either safe or unsafe to take during pregnancy. Do not take any medicines unless approved by your health care provider.  Take a prenatal vitamin that contains at least 600 micrograms (mcg) of folic acid. Eating and drinking  Eat a healthy diet that includes fresh fruits and vegetables, whole grains, good sources of protein such as meat, eggs, or tofu, and low-fat dairy products.  Avoid raw meat and unpasteurized juice, milk, and cheese. These carry germs that can harm you and your baby.  You may need to take these actions to prevent or treat constipation: ? Drink enough fluid to keep your urine pale yellow. ? Eat foods that are high in fiber, such as beans, whole grains, and fresh fruits and vegetables. ? Limit foods that are high in fat and processed sugars, such as fried or sweet foods. Activity  Exercise only as directed by your health care provider. Most people can continue their usual exercise routine during pregnancy. Try to exercise for 30 minutes at least 5 days a week. Stop exercising if you develop contractions in your uterus.  Stop exercising if you develop pain or cramping in the lower abdomen or lower back.  Avoid exercising if it is very hot or humid or if you are at  a high altitude.  Avoid heavy lifting.  If you choose to, you may have sex unless your health care provider tells you not to. Relieving pain and discomfort  Wear a supportive bra to prevent discomfort from breast tenderness.  Take warm sitz baths to soothe any pain or discomfort caused by hemorrhoids. Use hemorrhoid cream if your health care provider approves.  Rest with your legs raised (elevated) if you have leg cramps or low back pain.  If you develop  varicose veins: ? Wear support hose as told by your health care provider. ? Elevate your feet for 15 minutes, 3-4 times a day. ? Limit salt in your diet. Safety  Wear your seat belt at all times when driving or riding in a car.  Talk with your health care provider if someone is verbally or physically abusive to you. Lifestyle  Do not use hot tubs, steam rooms, or saunas.  Do not douche. Do not use tampons or scented sanitary pads.  Avoid cat litter boxes and soil used by cats. These carry germs that can cause birth defects in the baby and possibly loss of the fetus by miscarriage or stillbirth.  Do not use herbal remedies, alcohol, illegal drugs, or medicines that are not approved by your health care provider. Chemicals in these products can harm your baby.  Do not use any products that contain nicotine or tobacco, such as cigarettes, e-cigarettes, and chewing tobacco. If you need help quitting, ask your health care provider. General instructions  During a routine prenatal visit, your health care provider will do a physical exam and other tests. He or she will also discuss your overall health. Keep all follow-up visits. This is important.  Ask your health care provider for a referral to a local prenatal education class.  Ask for help if you have counseling or nutritional needs during pregnancy. Your health care provider can offer advice or refer you to specialists for help with various needs. Where to find more information  American Pregnancy Association: americanpregnancy.[place] and Gynecologists: PoolDevices.com.pt  Office on [employer]: KeywordPortfolios.com.br Contact a health care provider if you have:  A headache that does not go away when you take medicine.  Vision changes or you see spots in front of your eyes.  Mild pelvic cramps, pelvic pressure, or nagging pain in the abdominal area.  Persistent nausea,  vomiting, or diarrhea.  A bad-smelling vaginal discharge or foul-smelling urine.  Pain when you urinate.  Sudden or extreme swelling of your face, hands, ankles, feet, or legs.  A fever. Get help right away if you:  Have fluid leaking from your vagina.  Have spotting or bleeding from your vagina.  Have severe abdominal cramping or pain.  Have difficulty breathing.  Have chest pain.  Have fainting spells.  Have not felt your baby move for the time period told by your health care provider.  Have new or increased pain, swelling, or redness in an arm or leg. Summary  The second trimester of pregnancy is from week 13 through week 27 (months 4 through 6).  Do not use herbal remedies, alcohol, illegal drugs, or medicines that are not approved by your health care provider. Chemicals in these products can harm your baby.  Exercise only as directed by your health care provider. Most people can continue their usual exercise routine during pregnancy.  Keep all follow-up visits. This is important. This information is not intended to replace advice given to you by your  health care provider. Make sure you discuss any questions you have with your health care provider. Document Revised: 08/20/2019 Document Reviewed: 06/26/2019 Elsevier Patient Education  [place].   Common Medications Safe in Pregnancy  Acne:      Constipation:  Benzoyl Peroxide     Colace  Clindamycin      Dulcolax Suppository  Topica Erythromycin     Fibercon  Salicylic Acid      Metamucil         Miralax AVOID:        Senakot   Accutane    Cough:  Retin-A       Cough Drops  Tetracycline      Phenergan w/ Codeine if Rx  Minocycline      Robitussin (Plain & DM)  Antibiotics:     Crabs/Lice:  Ceclor       RID  Cephalosporins    AVOID:  E-Mycins      Kwell  Keflex  Macrobid/Macrodantin   Diarrhea:  Penicillin      Kao-Pectate  Zithromax      Imodium AD         PUSH  FLUIDS AVOID:       Cipro     Fever:  Tetracycline      Tylenol (Regular or Extra  Minocycline       Strength)  Levaquin      Extra Strength-Do not          Exceed 8 tabs/24 hrs Caffeine:        <261m/day (equiv. To 1 cup of coffee or  approx. 3 12 oz sodas)         Gas: Cold/Hayfever:       Gas-X  Benadryl      Mylicon  Claritin       Phazyme  **Claritin-D        Chlor-Trimeton    Headaches:  Dimetapp      ASA-Free Excedrin  Drixoral-Non-Drowsy     Cold Compress  Mucinex (Guaifenasin)     Tylenol (Regular or Extra  Sudafed/Sudafed-12 Hour     Strength)  **Sudafed PE Pseudoephedrine   Tylenol Cold & Sinus     Vicks Vapor Rub  Zyrtec  **AVOID if Problems With Blood Pressure         Heartburn: Avoid lying down for at least 1 hour after meals  Aciphex      Maalox     Rash:  Milk of Magnesia     Benadryl    Mylanta       1% Hydrocortisone Cream  Pepcid  Pepcid Complete   Sleep Aids:  Prevacid      Ambien   Prilosec       Benadryl  Rolaids       Chamomile Tea  Tums (Limit 4/day)     Unisom         Tylenol PM         Warm milk-add vanilla or  Hemorrhoids:       Sugar for taste  Anusol/Anusol H.C.  (RX: Analapram 2.5%)  Sugar Substitutes:  Hydrocortisone OTC     Ok in moderation  Preparation H      Tucks        Vaseline lotion applied to tissue with wiping    Herpes:     Throat:  Acyclovir      Oragel  Famvir  Valtrex     Vaccines:         Flu  Shot Leg Cramps:       *Gardasil  Benadryl      Hepatitis A         Hepatitis B Nasal Spray:       Pneumovax  Saline Nasal Spray     Polio Booster         Tetanus Nausea:       Tuberculosis test or PPD  Vitamin B6 25 mg TID   AVOID:    Dramamine      *Gardasil  Emetrol       Live Poliovirus  Ginger Root 250 mg QID    MMR (measles, mumps &  High Complex Carbs @ Bedtime    rebella)  Sea Bands-Accupressure    Varicella (Chickenpox)  Unisom 1/2 tab TID     *No known complications           If received  before Pain:         Known pregnancy;   Darvocet       Resume series after  Lortab        Delivery  Percocet    Yeast:   Tramadol      Femstat  Tylenol 3      Gyne-lotrimin  Ultram       Monistat  Vicodin           MISC:         All Sunscreens           Hair Coloring/highlights          Insect Repellant's          (Including DEET)         [facility]

## 2020-08-28 LAB — URINE CULTURE

## 2020-08-28 LAB — AFP, SERUM, OPEN SPINA BIFIDA
AFP MoM: 0.74
AFP Value: 20.8 ng/mL
Gest. Age on Collection Date: 17 weeks
Maternal Age At EDD: 35.8 yr
OSBR Risk 1 IN: 10000
Test Results:: NEGATIVE
Weight: 275 [lb_av]

## 2020-08-28 LAB — THYROID PANEL WITH TSH
Free Thyroxine Index: 1.4 (ref 1.2–4.9)
T3 Uptake Ratio: 11 % — ABNORMAL LOW (ref 24–39)
T4, Total: 12.3 ug/dL — ABNORMAL HIGH (ref 4.5–12.0)
TSH: 0.561 u[IU]/mL (ref 0.450–4.500)

## 2020-09-01 ENCOUNTER — Ambulatory Visit: Payer: BC Managed Care – PPO

## 2020-09-20 ENCOUNTER — Ambulatory Visit: Payer: BC Managed Care – PPO

## 2020-09-23 ENCOUNTER — Other Ambulatory Visit: Payer: Self-pay

## 2020-09-23 ENCOUNTER — Ambulatory Visit (INDEPENDENT_AMBULATORY_CARE_PROVIDER_SITE_OTHER): Payer: BC Managed Care – PPO | Admitting: Obstetrics and Gynecology

## 2020-09-23 ENCOUNTER — Encounter: Payer: Self-pay | Admitting: Obstetrics and Gynecology

## 2020-09-23 VITALS — BP 148/89 | HR 102 | Wt 282.1 lb

## 2020-09-23 DIAGNOSIS — O09522 Supervision of elderly multigravida, second trimester: Secondary | ICD-10-CM

## 2020-09-23 DIAGNOSIS — Z3A21 21 weeks gestation of pregnancy: Secondary | ICD-10-CM

## 2020-09-23 LAB — POCT URINALYSIS DIPSTICK OB
Bilirubin, UA: NEGATIVE
Blood, UA: NEGATIVE
Glucose, UA: NEGATIVE
Ketones, UA: NEGATIVE
Leukocytes, UA: NEGATIVE
Nitrite, UA: NEGATIVE
POC,PROTEIN,UA: NEGATIVE
Spec Grav, UA: 1.01 (ref 1.010–1.025)
Urobilinogen, UA: 0.2 E.U./dL
pH, UA: 7 (ref 5.0–8.0)

## 2020-09-23 NOTE — Progress Notes (Signed)
ROB: Has no complaints.  Feels fetal movement.  Desires vaginal birth if possible.  Anatomy ultrasound in 2 weeks.  Taking prenatal vitamins and aspirin as directed.

## 2020-10-07 ENCOUNTER — Other Ambulatory Visit: Payer: Self-pay

## 2020-10-07 ENCOUNTER — Ambulatory Visit: Payer: BC Managed Care – PPO | Attending: Obstetrics and Gynecology

## 2020-10-07 DIAGNOSIS — O09522 Supervision of elderly multigravida, second trimester: Secondary | ICD-10-CM | POA: Insufficient documentation

## 2020-10-07 DIAGNOSIS — E669 Obesity, unspecified: Secondary | ICD-10-CM | POA: Insufficient documentation

## 2020-10-07 DIAGNOSIS — O09812 Supervision of pregnancy resulting from assisted reproductive technology, second trimester: Secondary | ICD-10-CM

## 2020-10-07 DIAGNOSIS — Z3A00 Weeks of gestation of pregnancy not specified: Secondary | ICD-10-CM | POA: Diagnosis not present

## 2020-10-07 DIAGNOSIS — O99212 Obesity complicating pregnancy, second trimester: Secondary | ICD-10-CM | POA: Insufficient documentation

## 2020-10-07 DIAGNOSIS — Z3A23 23 weeks gestation of pregnancy: Secondary | ICD-10-CM

## 2020-10-07 DIAGNOSIS — O99210 Obesity complicating pregnancy, unspecified trimester: Secondary | ICD-10-CM

## 2020-10-13 ENCOUNTER — Telehealth: Payer: Self-pay | Admitting: Obstetrics and Gynecology

## 2020-10-13 ENCOUNTER — Encounter: Payer: Self-pay | Admitting: Surgical

## 2020-10-13 NOTE — Telephone Encounter (Signed)
Form has been faxed to dental office and sent to patients my chart.

## 2020-10-13 NOTE — Telephone Encounter (Signed)
Chelsea with [facility] called following up on dental clearance, she states that pt is in office. I made her aware that provider and nurse have been in clinic and probable will not be finished within the hour, she stated that they will probably reschedule pt. And wait for the clearance. Confirmed the fax number as [phone]

## 2020-10-13 NOTE — Telephone Encounter (Deleted)
10/13/2020   To whom it may concern:  Mary Bradford is a patient in [facility] practice on 10/13/2020. [name] has medical clearance for the following dental treatment:  Treatment may include the following:     If local anesthetic is needed, can be used.     If antibiotic is needed, Amoxicillian or Clindamycin can be used.     For non-narcotic pain management, OTC Acetaminophen can be recommended.     For narcotic pain relief, Acetaminophen with Codeine #3 can be prescribed.  The patient does NOT have medical clearance for the following dental treatment:     Radiographs  (If X-rays are absolutely necessary must use double apron. Preferably in 2nd and 3rd trimester.)  If you have any further questions regarding this patient's health status, please contact us at [phone].  Dr. Brennan Bailey

## 2020-10-13 NOTE — Telephone Encounter (Signed)
Patient called from dentist office requesting a dental medical clearance form to be faxed to her dentist [phone].  Patient states she is having her teeth cleaned and needed to have 2 cavities filled

## 2020-10-20 NOTE — Progress Notes (Signed)
  OB-Pt present for routine prenatal care. Pt stated fetal movement present; contractions absent; no vaginal bleeding and no changes in vaginal discharge.  Pt stated that she was doing well.  One hour glucose information explained and given to patient.

## 2020-10-20 NOTE — Patient Instructions (Addendum)
1-Hour Glucose  No dessert the night before No sweet drinks the day of- soda, fruit juice, sweet tea No sweet breakfast- pancakes, donuts May have mostly protein- egg, bacon, wheat toast, black coffee.               Grilled chicken, salad, vegetable, water.       3-Hour Glucose Test  Must be fasting.  Nothing to eat or drink after midnight.  May have morning medication with a sip of water.     WHAT OB PATIENTS CAN EXPECT  Confirmation of pregnancy and ultrasound ordered if medically indicated-[redacted] weeks gestation New OB (NOB) intake with nurse and New OB (NOB) labs- [redacted] weeks gestation New OB (NOB) physical examination with provider- 11/[redacted] weeks gestation Flu vaccine-[redacted] weeks gestation Anatomy scan-[redacted] weeks gestation Glucose tolerance test, blood work to test for anemia, T-dap vaccine-[redacted] weeks gestation Vaginal swabs/cultures-STD/Group B strep-[redacted] weeks gestation Appointments every 4 weeks until 28 weeks Every 2 weeks from 28 weeks until 36 weeks Weekly visits from 36 weeks until delivery  Common Medications Safe in Pregnancy  Acne:      Constipation:  Benzoyl Peroxide     Colace  Clindamycin      Dulcolax Suppository  Topica Erythromycin     Fibercon  Salicylic Acid      Metamucil         Miralax AVOID:        Senakot   Accutane    Cough:  Retin-A       Cough Drops  Tetracycline      Phenergan w/ Codeine if Rx  Minocycline      Robitussin (Plain & DM)  Antibiotics:     Crabs/Lice:  Ceclor       RID  Cephalosporins    AVOID:  E-Mycins      Kwell  Keflex  Macrobid/Macrodantin   Diarrhea:  Penicillin      Kao-Pectate  Zithromax      Imodium AD         PUSH FLUIDS AVOID:       Cipro     Fever:  Tetracycline      Tylenol (Regular or Extra  Minocycline       Strength)  Levaquin      Extra Strength-Do not          Exceed 8 tabs/24 hrs Caffeine:        <219m/day (equiv. To 1 cup of coffee or  approx. 3 12 oz  sodas)         Gas: Cold/Hayfever:       Gas-X  Benadryl      Mylicon  Claritin       Phazyme  **Claritin-D        Chlor-Trimeton    Headaches:  Dimetapp      ASA-Free Excedrin  Drixoral-Non-Drowsy     Cold Compress  Mucinex (Guaifenasin)     Tylenol (Regular or Extra  Sudafed/Sudafed-12 Hour     Strength)  **Sudafed PE Pseudoephedrine   Tylenol Cold & Sinus     Vicks Vapor Rub  Zyrtec  **AVOID if Problems With Blood Pressure         Heartburn: Avoid lying down for at least 1 hour after meals  Aciphex      Maalox     Rash:  Milk of Magnesia     Benadryl    Mylanta       1% Hydrocortisone Cream  Pepcid  Pepcid Complete  Sleep Aids:  Prevacid      Ambien   Prilosec       Benadryl  Rolaids       Chamomile Tea  Tums (Limit 4/day)     Unisom         Tylenol PM         Warm milk-add vanilla or  Hemorrhoids:       Sugar for taste  Anusol/Anusol H.C.  (RX: Analapram 2.5%)  Sugar Substitutes:  Hydrocortisone OTC     Ok in moderation  Preparation H      Tucks        Vaseline lotion applied to tissue with wiping    Herpes:     Throat:  Acyclovir      Oragel  Famvir  Valtrex     Vaccines:         Flu Shot Leg Cramps:       *Gardasil  Benadryl      Hepatitis A         Hepatitis B Nasal Spray:       Pneumovax  Saline Nasal Spray     Polio Booster         Tetanus Nausea:       Tuberculosis test or PPD  Vitamin B6 25 mg TID   AVOID:    Dramamine      *Gardasil  Emetrol       Live Poliovirus  Ginger Root 250 mg QID    MMR (measles, mumps &  High Complex Carbs @ Bedtime    rebella)  Sea Bands-Accupressure    Varicella (Chickenpox)  Unisom 1/2 tab TID     *No known complications           If received before Pain:         Known pregnancy;   Darvocet       Resume series after  Lortab        Delivery  Percocet    Yeast:   Tramadol      Femstat  Tylenol 3      Gyne-lotrimin  Ultram       Monistat  Vicodin           MISC:         All Sunscreens           Hair  Coloring/highlights          Insect Repellant's          (Including DEET)         Mystic Tans     Tdap (Tetanus, Diphtheria, Pertussis) Vaccine: What You Need to Know 1. Why get vaccinated? Tdap vaccine can prevent tetanus, diphtheria, and pertussis. Diphtheria and pertussis spread from person to person. Tetanus enters the body through cuts or wounds. TETANUS (T) causes painful stiffening of the muscles. Tetanus can lead to serious health problems, including being unable to open the mouth, having trouble swallowing and breathing, or death. DIPHTHERIA (D) can lead to difficulty breathing, heart failure, paralysis, or death. PERTUSSIS (aP), also known as "whooping cough," can cause uncontrollable, violent coughing that makes it hard to breathe, eat, or drink. Pertussis can be extremely serious especially in babies and young children, causing pneumonia, convulsions, brain damage, or death. In teens and adults, it can cause weight loss, loss of bladder control, passing out, and rib fractures from severe coughing. 2. Tdap vaccine Tdap is only for children 7 years and older, adolescents, and adults.  Adolescents should receive a  single dose of Tdap, preferably at age 80 or 27 years. Pregnant people should get a dose of Tdap during every pregnancy, preferably during the early part of the third trimester, to help protect the newborn from pertussis. Infants are most at risk for severe, life-threatening complications frompertussis. Adults who have never received Tdap should get a dose of Tdap. Also, adults should receive a booster dose of either Tdap or Td (a different vaccine that protects against tetanus and diphtheria but not pertussis) every 10 years, or after 5 years in the case of a severe or dirty wound or burn. Tdap may be given at the same time as other vaccines. 3. Talk with your health care provider Tell your vaccine provider if the person getting the vaccine: Has had an allergic reaction  after a previous dose of any vaccine that protects against tetanus, diphtheria, or pertussis, or has any severe, life-threatening allergies Has had a coma, decreased level of consciousness, or prolonged seizures within 7 days after a previous dose of any pertussis vaccine (DTP, DTaP, or Tdap) Has seizures or another nervous system problem Has ever had Guillain-Barr Syndrome (also called "GBS") Has had severe pain or swelling after a previous dose of any vaccine that protects against tetanus or diphtheria In some cases, your health care provider may decide to postpone Tdapvaccination until a future visit. People with minor illnesses, such as a cold, may be vaccinated. People who are moderately or severely ill should usually wait until they recover beforegetting Tdap vaccine.  Your health care provider can give you more information. 4. Risks of a vaccine reaction Pain, redness, or swelling where the shot was given, mild fever, headache, feeling tired, and nausea, vomiting, diarrhea, or stomachache sometimes happen after Tdap vaccination. People sometimes faint after medical procedures, including vaccination. Tellyour provider if you feel dizzy or have vision changes or ringing in the ears.  As with any medicine, there is a very remote chance of a vaccine causing asevere allergic reaction, other serious injury, or death. 5. What if there is a serious problem? An allergic reaction could occur after the vaccinated person leaves the clinic. If you see signs of a severe allergic reaction (hives, swelling of the face and throat, difficulty breathing, a fast heartbeat, dizziness, or weakness), call 9-1-1and get the person to the nearest hospital. For other signs that concern you, call your health care provider.  Adverse reactions should be reported to the Vaccine Adverse Event Reporting System (VAERS). Your health care provider will usually file this report, or you can do it yourself. Visit the VAERS website  at www.vaers.SamedayNews.es or call 873-176-1773. VAERS is only for reporting reactions, and VAERS staff members do not give medical advice. 6. The National Vaccine Injury Compensation Program The [employer] Vaccine Injury Compensation Program (VICP) is a federal program that was created to compensate people who may have been injured by certain vaccines. Claims regarding alleged injury or death due to vaccination have a time limit for filing, which may be as short as two years. Visit the VICP website at GoldCloset.com.ee or call 773-121-1995to learn about the program and about filing a claim. 7. How can I learn more? Ask your health care provider. Call your local or state health department. Visit the website of the Food and Drug Administration (FDA) for vaccine package inserts and additional information at TraderRating.uy. Contact the Centers for Disease Control and Prevention (CDC): Call 540-320-2661 (1-800-CDC-INFO) or Visit CDC's website at http://hunter.com/. Vaccine Information Statement Tdap (Tetanus, Diphtheria, Pertussis) Vaccine(10/31/2019)  This information is not intended to replace advice given to you by your health care provider. Make sure you discuss any questions you have with your healthcare provider. Document Revised: 11/26/2019 Document Reviewed: 11/26/2019 Elsevier Patient Education  2022 [employer].

## 2020-10-21 ENCOUNTER — Other Ambulatory Visit: Payer: Self-pay

## 2020-10-21 ENCOUNTER — Encounter: Payer: Self-pay | Admitting: Obstetrics and Gynecology

## 2020-10-21 ENCOUNTER — Ambulatory Visit (INDEPENDENT_AMBULATORY_CARE_PROVIDER_SITE_OTHER): Payer: BC Managed Care – PPO | Admitting: Obstetrics and Gynecology

## 2020-10-21 VITALS — BP 127/81 | HR 103 | Wt 288.1 lb

## 2020-10-21 DIAGNOSIS — O34219 Maternal care for unspecified type scar from previous cesarean delivery: Secondary | ICD-10-CM | POA: Insufficient documentation

## 2020-10-21 DIAGNOSIS — Z3A24 24 weeks gestation of pregnancy: Secondary | ICD-10-CM

## 2020-10-21 DIAGNOSIS — O0992 Supervision of high risk pregnancy, unspecified, second trimester: Secondary | ICD-10-CM

## 2020-10-21 DIAGNOSIS — O99210 Obesity complicating pregnancy, unspecified trimester: Secondary | ICD-10-CM

## 2020-10-21 DIAGNOSIS — Z3482 Encounter for supervision of other normal pregnancy, second trimester: Secondary | ICD-10-CM

## 2020-10-21 LAB — POCT URINALYSIS DIPSTICK OB
Bilirubin, UA: NEGATIVE
Blood, UA: NEGATIVE
Glucose, UA: NEGATIVE
Ketones, UA: NEGATIVE
Leukocytes, UA: NEGATIVE
Nitrite, UA: NEGATIVE
POC,PROTEIN,UA: NEGATIVE
Spec Grav, UA: 1.01 (ref 1.010–1.025)
Urobilinogen, UA: 0.2 E.U./dL
pH, UA: 6.5 (ref 5.0–8.0)

## 2020-10-21 NOTE — Progress Notes (Signed)
ROB: Patient doing well, no complaints. Further discussed TOLAC vs repeat C-section, desires TOLAC.  Counseled regarding TOLAC vs RCS; risks/benefits discussed in detail. All questions answered.  Patient elects for TOLAC. Discussed need for Anesthesia consult if BMI > 45 (currently 43.8). Discussed breastfeeding. RTC in 4 weeks, for 28 week labs at that time.    The following were addressed during this visit:  Breastfeeding Education - Early initiation of breastfeeding    Comments: Keeps milk supply adequate, helps contract uterus and slow bleeding, and early milk is the perfect first food and is easy to digest.   - The importance of exclusive breastfeeding    Comments: Provides antibodies, Lower risk of breast and ovarian cancers, and type-2 diabetes,Helps your body recover, Reduced chance of SIDS.   - Exclusive breastfeeding for the first 6 months    Comments: Builds a healthy milk supply and keeps it up, protects baby from sickness and disease, and breastmilk has everything your baby needs for the first 6 months.

## 2020-11-01 ENCOUNTER — Other Ambulatory Visit: Payer: Self-pay | Admitting: Maternal & Fetal Medicine

## 2020-11-01 DIAGNOSIS — O34219 Maternal care for unspecified type scar from previous cesarean delivery: Secondary | ICD-10-CM

## 2020-11-01 DIAGNOSIS — Z3A27 27 weeks gestation of pregnancy: Secondary | ICD-10-CM

## 2020-11-01 DIAGNOSIS — O09812 Supervision of pregnancy resulting from assisted reproductive technology, second trimester: Secondary | ICD-10-CM

## 2020-11-01 DIAGNOSIS — O0992 Supervision of high risk pregnancy, unspecified, second trimester: Secondary | ICD-10-CM

## 2020-11-01 DIAGNOSIS — O09522 Supervision of elderly multigravida, second trimester: Secondary | ICD-10-CM

## 2020-11-01 DIAGNOSIS — O99212 Obesity complicating pregnancy, second trimester: Secondary | ICD-10-CM

## 2020-11-04 ENCOUNTER — Ambulatory Visit: Payer: BC Managed Care – PPO | Attending: Maternal & Fetal Medicine

## 2020-11-04 ENCOUNTER — Other Ambulatory Visit: Payer: Self-pay

## 2020-11-04 VITALS — BP 132/89 | HR 103 | Temp 97.5°F | Ht 68.0 in | Wt 288.5 lb

## 2020-11-04 DIAGNOSIS — O99212 Obesity complicating pregnancy, second trimester: Secondary | ICD-10-CM | POA: Diagnosis not present

## 2020-11-04 DIAGNOSIS — O34219 Maternal care for unspecified type scar from previous cesarean delivery: Secondary | ICD-10-CM

## 2020-11-04 DIAGNOSIS — O09812 Supervision of pregnancy resulting from assisted reproductive technology, second trimester: Secondary | ICD-10-CM | POA: Diagnosis present

## 2020-11-04 DIAGNOSIS — O09522 Supervision of elderly multigravida, second trimester: Secondary | ICD-10-CM

## 2020-11-04 DIAGNOSIS — Z3A27 27 weeks gestation of pregnancy: Secondary | ICD-10-CM

## 2020-11-04 DIAGNOSIS — E669 Obesity, unspecified: Secondary | ICD-10-CM | POA: Diagnosis not present

## 2020-11-04 DIAGNOSIS — O0992 Supervision of high risk pregnancy, unspecified, second trimester: Secondary | ICD-10-CM | POA: Diagnosis not present

## 2020-11-19 ENCOUNTER — Other Ambulatory Visit: Payer: Self-pay

## 2020-11-19 ENCOUNTER — Other Ambulatory Visit: Payer: BC Managed Care – PPO

## 2020-11-19 ENCOUNTER — Ambulatory Visit (INDEPENDENT_AMBULATORY_CARE_PROVIDER_SITE_OTHER): Payer: BC Managed Care – PPO | Admitting: Obstetrics and Gynecology

## 2020-11-19 ENCOUNTER — Encounter: Payer: Self-pay | Admitting: Obstetrics and Gynecology

## 2020-11-19 VITALS — BP 120/79 | HR 121 | Wt 287.2 lb

## 2020-11-19 DIAGNOSIS — Z2913 Encounter for prophylactic Rho(D) immune globulin: Secondary | ICD-10-CM

## 2020-11-19 DIAGNOSIS — Z3A29 29 weeks gestation of pregnancy: Secondary | ICD-10-CM

## 2020-11-19 DIAGNOSIS — Z3483 Encounter for supervision of other normal pregnancy, third trimester: Secondary | ICD-10-CM | POA: Diagnosis not present

## 2020-11-19 DIAGNOSIS — Z23 Encounter for immunization: Secondary | ICD-10-CM | POA: Diagnosis not present

## 2020-11-19 DIAGNOSIS — O0992 Supervision of high risk pregnancy, unspecified, second trimester: Secondary | ICD-10-CM

## 2020-11-19 LAB — POCT URINALYSIS DIPSTICK OB
Bilirubin, UA: NEGATIVE
Glucose, UA: NEGATIVE
Ketones, UA: NEGATIVE
Leukocytes, UA: NEGATIVE
Nitrite, UA: NEGATIVE
POC,PROTEIN,UA: NEGATIVE
Spec Grav, UA: 1.01 (ref 1.010–1.025)
Urobilinogen, UA: 0.2 E.U./dL
pH, UA: 7 (ref 5.0–8.0)

## 2020-11-19 MED ORDER — RHO D IMMUNE GLOBULIN 1500 UNIT/2ML IJ SOSY
300.0000 ug | PREFILLED_SYRINGE | Freq: Once | INTRAMUSCULAR | Status: AC
  Administered 2020-11-19: 300 ug via INTRAMUSCULAR

## 2020-11-19 MED ORDER — TETANUS-DIPHTH-ACELL PERTUSSIS 5-2.5-18.5 LF-MCG/0.5 IM SUSY
0.5000 mL | PREFILLED_SYRINGE | Freq: Once | INTRAMUSCULAR | Status: AC
  Administered 2020-11-19: 0.5 mL via INTRAMUSCULAR

## 2020-11-19 NOTE — Progress Notes (Signed)
OB-pt present for routine prenatal care. Pt c/o lower abd and vaginal pain. Tdap, rhogam and BTC completed.

## 2020-11-19 NOTE — Progress Notes (Signed)
ROB: Patient was describing some pelvic pressure a few weeks ago but she says this has resolved.  TOLAC again discussed.  Methods of induction and timing of induction discussed.  All questions answered.  BMI currently stable at 43.

## 2020-11-19 NOTE — Patient Instructions (Signed)
Tdap (Tetanus, Diphtheria, Pertussis) Vaccine: What You Need to Know 1. Why get vaccinated? Tdap vaccine can prevent tetanus, diphtheria, and pertussis. Diphtheria and pertussis spread from person to person. Tetanus enters the body through cuts or wounds. TETANUS (T) causes painful stiffening of the muscles. Tetanus can lead to serious health problems, including being unable to open the mouth, having trouble swallowing and breathing, or death. DIPHTHERIA (D) can lead to difficulty breathing, heart failure, paralysis, or death. PERTUSSIS (aP), also known as "whooping cough," can cause uncontrollable, violent coughing that makes it hard to breathe, eat, or drink. Pertussis can be extremely serious especially in babies and young children, causing pneumonia, convulsions, brain damage, or death. In teens and adults, it can cause weight loss, loss of bladder control, passing out, and rib fractures from severe coughing. 2. Tdap vaccine Tdap is only for children 7 years and older, adolescents, and adults.  Adolescents should receive a single dose of Tdap, preferably at age 29 or 72 years. Pregnant people should get a dose of Tdap during every pregnancy, preferably during the early part of the third trimester, to help protect the newborn from pertussis. Infants are most at risk for severe, life-threatening complications frompertussis. Adults who have never received Tdap should get a dose of Tdap. Also, adults should receive a booster dose of either Tdap or Td (a different vaccine that protects against tetanus and diphtheria but not pertussis) every 10 years, or after 5 years in the case of a severe or dirty wound or burn. Tdap may be given at the same time as other vaccines. 3. Talk with your health care provider Tell your vaccine provider if the person getting the vaccine: Has had an allergic reaction after a previous dose of any vaccine that protects against tetanus, diphtheria, or pertussis, or has any  severe, life-threatening allergies Has had a coma, decreased level of consciousness, or prolonged seizures within 7 days after a previous dose of any pertussis vaccine (DTP, DTaP, or Tdap) Has seizures or another nervous system problem Has ever had Guillain-Barr Syndrome (also called "GBS") Has had severe pain or swelling after a previous dose of any vaccine that protects against tetanus or diphtheria In some cases, your health care provider may decide to postpone Tdapvaccination until a future visit. People with minor illnesses, such as a cold, may be vaccinated. People who are moderately or severely ill should usually wait until they recover beforegetting Tdap vaccine.  Your health care provider can give you more information. 4. Risks of a vaccine reaction Pain, redness, or swelling where the shot was given, mild fever, headache, feeling tired, and nausea, vomiting, diarrhea, or stomachache sometimes happen after Tdap vaccination. People sometimes faint after medical procedures, including vaccination. Tellyour provider if you feel dizzy or have vision changes or ringing in the ears.  As with any medicine, there is a very remote chance of a vaccine causing asevere allergic reaction, other serious injury, or death. 5. What if there is a serious problem? An allergic reaction could occur after the vaccinated person leaves the clinic. If you see signs of a severe allergic reaction (hives, swelling of the face and throat, difficulty breathing, a fast heartbeat, dizziness, or weakness), call 9-1-1and get the person to the nearest hospital. For other signs that concern you, call your health care provider.  Adverse reactions should be reported to the Vaccine Adverse Event Reporting System (VAERS). Your health care provider will usually file this report, or you can do it yourself. Visit the  VAERS website at www.vaers.SamedayNews.es or call (703)194-5506. VAERS is only for reporting reactions, and VAERS staff  members do not give medical advice. 6. The National Vaccine Injury Compensation Program The [employer] Vaccine Injury Compensation Program (VICP) is a federal program that was created to compensate people who may have been injured by certain vaccines. Claims regarding alleged injury or death due to vaccination have a time limit for filing, which may be as short as two years. Visit the VICP website at GoldCloset.com.ee or call (314)026-7475to learn about the program and about filing a claim. 7. How can I learn more? Ask your health care provider. Call your local or state health department. Visit the website of the Food and Drug Administration (FDA) for vaccine package inserts and additional information at TraderRating.uy. Contact the Centers for Disease Control and Prevention (CDC): Call (219) 159-6861 (1-800-CDC-INFO) or Visit CDC's website at http://hunter.com/. Vaccine Information Statement Tdap (Tetanus, Diphtheria, Pertussis) Vaccine(10/31/2019) This information is not intended to replace advice given to you by your health care provider. Make sure you discuss any questions you have with your healthcare provider. Document Revised: 11/26/2019 Document Reviewed: 11/26/2019 Elsevier Patient Education  2022 [employer] [D] Immune Globulin injection What is this medication? RhO [D] IMMUNE GLOBULIN (i MYOON GLOB yoo lin) is used to treat idiopathic thrombocytopenic purpura (ITP). This medicine is used in RhO negative mothers who are pregnant with a RhO positive child. It is also used after a transfusionof RhO positive blood into a RhO negative person. This medicine may be used for other purposes; ask your health care provider orpharmacist if you have questions. COMMON BRAND NAME(S): BayRho-D, HyperRHO S/D, MICRhoGAM, RhoGAM, Rhophylac,WinRho SDF What should I tell my care team before I take this medication? They need to know if you have any of  these conditions: bleeding disorders low levels of immunoglobulin A in the body no spleen an unusual or allergic reaction to human immune globulin, other medicines, foods, dyes, or preservatives pregnant or trying to get pregnant breast-feeding How should I use this medication? This medicine is for injection into a muscle or into a vein. It is given by ahealth care professional in a hospital or clinic setting. Talk to your pediatrician regarding the use of this medicine in children. Thismedicine is not approved for use in children. Overdosage: If you think you have taken too much of this medicine contact apoison control center or emergency room at once. NOTE: This medicine is only for you. Do not share this medicine with others. What if I miss a dose? It is important not to miss your dose. Call your doctor or health careprofessional if you are unable to keep an appointment. What may interact with this medication? live virus vaccines, like measles, mumps, or rubella This list may not describe all possible interactions. Give your health care provider a list of all the medicines, herbs, non-prescription drugs, or dietary supplements you use. Also tell them if you smoke, drink alcohol, or use illegaldrugs. Some items may interact with your medicine. What should I watch for while using this medication? This medicine is made from human blood. It may be possible to pass an infection in this medicine. Talk to your doctor about the risks and benefits of thismedicine. This medicine may interfere with live virus vaccines. Before you get live virus vaccines tell your health care professional if you have received this medicinewithin the past 3 months. What side effects may I notice from receiving this medication? Side effects that you should  report to your doctor or health care professionalas soon as possible: allergic reactions like skin rash, itching or hives, swelling of the face, lips, or  tongue breathing problems chest pain or tightness yellowing of the eyes or skin Side effects that usually do not require medical attention (report to yourdoctor or health care professional if they continue or are bothersome): fever pain and tenderness at site where injected This list may not describe all possible side effects. Call your doctor for medical advice about side effects. You may report side effects to FDA at1-800-FDA-1088. Where should I keep my medication? This drug is given in a hospital or clinic and will not be stored at home. NOTE: This sheet is a summary. It may not cover all possible information. If you have questions about this medicine, talk to your doctor, pharmacist, orhealth care provider.  2022 Elsevier/Gold Standard (2007-11-11 14:06:10) Common Medications Safe in Pregnancy  Acne:      Constipation:  Benzoyl Peroxide     Colace  Clindamycin      Dulcolax Suppository  Topica Erythromycin     Fibercon  Salicylic Acid      Metamucil         Miralax AVOID:        Senakot   Accutane    Cough:  Retin-A       Cough Drops  Tetracycline      Phenergan w/ Codeine if Rx  Minocycline      Robitussin (Plain & DM)  Antibiotics:     Crabs/Lice:  Ceclor       RID  Cephalosporins    AVOID:  E-Mycins      Kwell  Keflex  Macrobid/Macrodantin   Diarrhea:  Penicillin      Kao-Pectate  Zithromax      Imodium AD         PUSH FLUIDS AVOID:       Cipro     Fever:  Tetracycline      Tylenol (Regular or Extra  Minocycline       Strength)  Levaquin      Extra Strength-Do not          Exceed 8 tabs/24 hrs Caffeine:        '200mg'$ /day (equiv. To 1 cup of coffee or  approx. 3 12 oz sodas)         Gas: Cold/Hayfever:       Gas-X  Benadryl      Mylicon  Claritin       Phazyme  **Claritin-D        Chlor-Trimeton    Headaches:  Dimetapp      ASA-Free Excedrin  Drixoral-Non-Drowsy     Cold Compress  Mucinex (Guaifenasin)     Tylenol (Regular or Extra  Sudafed/Sudafed-12  Hour     Strength)  **Sudafed PE Pseudoephedrine   Tylenol Cold & Sinus     Vicks Vapor Rub  Zyrtec  **AVOID if Problems With Blood Pressure         Heartburn: Avoid lying down for at least 1 hour after meals  Aciphex      Maalox     Rash:  Milk of Magnesia     Benadryl    Mylanta       1% Hydrocortisone Cream  Pepcid  Pepcid Complete   Sleep Aids:  Prevacid      Ambien   Prilosec       Benadryl  Rolaids       Chamomile Tea  Tums (Limit 4/day)     Unisom         Tylenol PM         Warm milk-add vanilla or  Hemorrhoids:       Sugar for taste  Anusol/Anusol H.C.  (RX: Analapram 2.5%)  Sugar Substitutes:  Hydrocortisone OTC     Ok in moderation  Preparation H      Tucks        Vaseline lotion applied to tissue with wiping    Herpes:     Throat:  Acyclovir      Oragel  Famvir  Valtrex     Vaccines:         Flu Shot Leg Cramps:       *Gardasil  Benadryl      Hepatitis A         Hepatitis B Nasal Spray:       Pneumovax  Saline Nasal Spray     Polio Booster         Tetanus Nausea:       Tuberculosis test or PPD  Vitamin B6 25 mg TID   AVOID:    Dramamine      *Gardasil  Emetrol       Live Poliovirus  Ginger Root 250 mg QID    MMR (measles, mumps &  High Complex Carbs @ Bedtime    rebella)  Sea Bands-Accupressure    Varicella (Chickenpox)  Unisom 1/2 tab TID     *No known complications           If received before Pain:         Known pregnancy;   Darvocet       Resume series after  Lortab        Delivery  Percocet    Yeast:   Tramadol      Femstat  Tylenol 3      Gyne-lotrimin  Ultram       Monistat  Vicodin           MISC:         All Sunscreens           Hair Coloring/highlights          Insect Repellant's          (Including DEET)         Mystic Tans

## 2020-11-20 LAB — GLUCOSE, 1 HOUR GESTATIONAL: Gestational Diabetes Screen: 130 mg/dL (ref 65–139)

## 2020-11-20 LAB — CBC
Hematocrit: 34.7 % (ref 34.0–46.6)
Hemoglobin: 11.3 g/dL (ref 11.1–15.9)
MCH: 27.1 pg (ref 26.6–33.0)
MCHC: 32.6 g/dL (ref 31.5–35.7)
MCV: 83 fL (ref 79–97)
Platelets: 319 10*3/uL (ref 150–450)
RBC: 4.17 x10E6/uL (ref 3.77–5.28)
RDW: 13.8 % (ref 11.7–15.4)
WBC: 11.9 10*3/uL — ABNORMAL HIGH (ref 3.4–10.8)

## 2020-11-20 LAB — RPR: RPR Ser Ql: NONREACTIVE

## 2020-12-02 ENCOUNTER — Other Ambulatory Visit: Payer: Self-pay | Admitting: Maternal & Fetal Medicine

## 2020-12-02 DIAGNOSIS — O09813 Supervision of pregnancy resulting from assisted reproductive technology, third trimester: Secondary | ICD-10-CM

## 2020-12-02 DIAGNOSIS — O34219 Maternal care for unspecified type scar from previous cesarean delivery: Secondary | ICD-10-CM

## 2020-12-02 DIAGNOSIS — O99213 Obesity complicating pregnancy, third trimester: Secondary | ICD-10-CM

## 2020-12-02 DIAGNOSIS — O09523 Supervision of elderly multigravida, third trimester: Secondary | ICD-10-CM

## 2020-12-07 ENCOUNTER — Other Ambulatory Visit: Payer: Self-pay

## 2020-12-07 ENCOUNTER — Ambulatory Visit: Payer: BC Managed Care – PPO | Attending: Maternal & Fetal Medicine

## 2020-12-07 DIAGNOSIS — E669 Obesity, unspecified: Secondary | ICD-10-CM

## 2020-12-07 DIAGNOSIS — O99213 Obesity complicating pregnancy, third trimester: Secondary | ICD-10-CM

## 2020-12-07 DIAGNOSIS — O34219 Maternal care for unspecified type scar from previous cesarean delivery: Secondary | ICD-10-CM | POA: Diagnosis not present

## 2020-12-07 DIAGNOSIS — Z3A31 31 weeks gestation of pregnancy: Secondary | ICD-10-CM | POA: Diagnosis not present

## 2020-12-07 DIAGNOSIS — O09523 Supervision of elderly multigravida, third trimester: Secondary | ICD-10-CM | POA: Diagnosis not present

## 2020-12-07 DIAGNOSIS — O09813 Supervision of pregnancy resulting from assisted reproductive technology, third trimester: Secondary | ICD-10-CM

## 2020-12-07 DIAGNOSIS — O09522 Supervision of elderly multigravida, second trimester: Secondary | ICD-10-CM | POA: Insufficient documentation

## 2020-12-13 NOTE — Patient Instructions (Signed)
Breastfeeding and Breast Care It is normal to have some problems when you start to breastfeed your new baby. But there are things that you can do to take care of yourself and help prevent problems. This includes keeping your breasts healthy and making sure that your baby's mouth attaches (latches) properly to your nipple for feedings. Work with your doctor or breastfeeding specialist to find what works best for you. How does self-care benefit me? If you keep your breasts healthy and you let your baby attach to your nipples in the right way, you will avoid these problems: Cracked or sore nipples. Breasts becoming overfilled with milk. Plugged milk ducts. Low milk supply. Breast swelling or infection. How does self-care benefit my baby? By preventing problems with your breasts, you will ensure that your baby will feed well and will gain the right amount of weight. What actions can I take to care for myself during breastfeeding? Best ways to breastfeed Always make sure that your baby latches properly to breastfeed. Make sure that your baby is in a proper position. Try different breastfeeding positions to find one that works best for you and your baby. Breastfeed when you feel like you need to make your breasts less full or when your baby shows signs of hunger. This is called "breastfeeding on demand." Do not delay feedings. Try to relax when it is time to feed your baby. This helps your body release milk from your breast. To help increase milk flow, do these things before feeding: Remove a small amount of milk from your breast. Use a pump or squeeze with your hand. Apply warm, moist heat to your breast. Do this in the shower or use hand towels soaked with warm water. Massage your breasts. Do this when you are breastfeeding as well. Caring for your breasts   To help your breasts stay healthy and keep them from getting too dry: Avoid using soap on your nipples. Let your nipples air-dry for 3-4  minutes after each feeding. Do not use things like a hair dryer to dry your breasts. This can make the skin dry and will cause irritation and pain. Use only cotton bra pads to soak up breast milk that leaks. Change the pads if they become soaked with milk. If you use bra pads that can be thrown away, change them often. Put some lanolin on your nipples after breastfeeding. Pure lanolin does not need to be washed off your nipple before you feed your baby again. Pure lanolin is not harmful to your baby. Rub some breast milk into your nipples: Use your hand to squeeze out a few drops of breast milk. Gently massage the milk into your nipples. Let your nipples air-dry. Wear a supportive nursing bra. Avoid wearing: Tight clothing. Underwire bras or bras that put pressure on your breasts. Use ice to help relieve pain or swelling of your breasts: Put ice in a plastic bag. Place a towel between your skin and the bag. Leave the ice on for 20 minutes, 2-3 times a day. Follow these instructions at home: Drink enough fluid to keep your pee (urine) pale yellow. Get plenty of rest. Sleep when your baby sleeps. Talk to your doctor or breastfeeding specialist before taking any herbal supplements. Eat a balanced diet. This includes fruits, vegetables, whole grains, lean proteins, and dairy or dairy alternatives Contact a health care provider if: You have nipple pain. You have cracking or soreness in your nipples that lasts longer than 1 week. Your breasts are  overfilled with milk, and this lasts longer than 48 hours. You have a fever. You have pus-like fluid coming from your nipple. You have redness, a rash, swelling, itching, or burning on your breast. Your baby does not gain weight. Your baby loses weight. Your baby is not feeding regularly or is very sleepy and lacks energy. Summary There are things that you can do to take care of yourself and help prevent many common breastfeeding problems. Always  make sure that your baby's mouth attaches (latches) to your nipple properly to breastfeed. Keep your nipples from getting too dry, drink plenty of fluid, and get plenty of rest. Feed on demand. Do not delay feedings. This information is not intended to replace advice given to you by your health care provider. Make sure you discuss any questions you have with your health care provider. Document Revised: 09/02/2019 Document Reviewed: 09/02/2019 Elsevier Patient Education  [employer].    Influenza (Flu) Vaccine (Inactivated or Recombinant): What You Need to Know 1. Why get vaccinated? Influenza vaccine can prevent influenza (flu). Flu is a contagious disease that spreads around the [place] every year, usually between October and May. Anyone can get the flu, but it is more dangerous for some people. Infants and young children, people 79 years and older, pregnant people, and people with certain health conditions or a weakened immune system are at greatest risk of flu complications. Pneumonia, bronchitis, sinus infections, and ear infections are examples of flu-related complications. If you have a medical condition, such as heart disease, cancer, or diabetes, flu can make it worse. Flu can cause fever and chills, sore throat, muscle aches, fatigue, cough, headache, and runny or stuffy nose. Some people may have vomiting and diarrhea, though this is more common in children than adults. In an average year, thousands of people in the [place] die from flu, and many more are hospitalized. Flu vaccine prevents millions of illnesses and flu-related visits to the doctor each year. 2. Influenza vaccines CDC recommends everyone 6 months and older get vaccinated every flu season. Children 6 months through 9 years of age may need 2 doses during a single flu season. Everyone else needs only 1 dose each flu season. It takes about 2 weeks for protection to develop after vaccination. There are many  flu viruses, and they are always changing. Each year a new flu vaccine is made to protect against the influenza viruses believed to be likely to cause disease in the upcoming flu season. Even when the vaccine doesn't exactly match these viruses, it may still provide some protection. Influenza vaccine does not cause flu. Influenza vaccine may be given at the same time as other vaccines. 3. Talk with your health care provider Tell your vaccination provider if the person getting the vaccine: Has had an allergic reaction after a previous dose of influenza vaccine, or has any severe, life-threatening allergies Has ever had Guillain-Barr Syndrome (also called "GBS") In some cases, your health care provider may decide to postpone influenza vaccination until a future visit. Influenza vaccine can be administered at any time during pregnancy. People who are or will be pregnant during influenza season should receive inactivated influenza vaccine. People with minor illnesses, such as a cold, may be vaccinated. People who are moderately or severely ill should usually wait until they recover before getting influenza vaccine. Your health care provider can give you more information. 4. Risks of a vaccine reaction Soreness, redness, and swelling where the shot is given, fever, muscle  aches, and headache can happen after influenza vaccination. There may be a very small increased risk of Guillain-Barr Syndrome (GBS) after inactivated influenza vaccine (the flu shot). Young children who get the flu shot along with pneumococcal vaccine (PCV13) and/or DTaP vaccine at the same time might be slightly more likely to have a seizure caused by fever. Tell your health care provider if a child who is getting flu vaccine has ever had a seizure. People sometimes faint after medical procedures, including vaccination. Tell your provider if you feel dizzy or have vision changes or ringing in the ears. As with any medicine, there is  a very remote chance of a vaccine causing a severe allergic reaction, other serious injury, or death. 5. What if there is a serious problem? An allergic reaction could occur after the vaccinated person leaves the clinic. If you see signs of a severe allergic reaction (hives, swelling of the face and throat, difficulty breathing, a fast heartbeat, dizziness, or weakness), call 9-1-1 and get the person to the nearest hospital. For other signs that concern you, call your health care provider. Adverse reactions should be reported to the Vaccine Adverse Event Reporting System (VAERS). Your health care provider will usually file this report, or you can do it yourself. Visit the VAERS website at www.vaers.SamedayNews.es or call 8206520531. VAERS is only for reporting reactions, and VAERS staff members do not give medical advice. 6. The National Vaccine Injury Compensation Program The [employer] Vaccine Injury Compensation Program (VICP) is a federal program that was created to compensate people who may have been injured by certain vaccines. Claims regarding alleged injury or death due to vaccination have a time limit for filing, which may be as short as two years. Visit the VICP website at GoldCloset.com.ee or call 902-733-4108 to learn about the program and about filing a claim. 7. How can I learn more? Ask your health care provider. Call your local or state health department. Visit the website of the Food and Drug Administration (FDA) for vaccine package inserts and additional information at TraderRating.uy. Contact the Centers for Disease Control and Prevention (CDC): Call 630-834-5580 (1-800-CDC-INFO) or Visit CDC's website at https://gibson.com/. Vaccine Information Statement Inactivated Influenza Vaccine (10/31/2019) This information is not intended to replace advice given to you by your health care provider. Make sure you discuss any questions you have with your  health care provider. Document Revised: 12/18/2019 Document Reviewed: 12/18/2019 Elsevier Patient Education  2022 [employer]. Common Medications Safe in Pregnancy  Acne:      Constipation:  Benzoyl Peroxide     Colace  Clindamycin      Dulcolax Suppository  Topica Erythromycin     Fibercon  Salicylic Acid      Metamucil         Miralax AVOID:        Senakot   Accutane    Cough:  Retin-A       Cough Drops  Tetracycline      Phenergan w/ Codeine if Rx  Minocycline      Robitussin (Plain & DM)  Antibiotics:     Crabs/Lice:  Ceclor       RID  Cephalosporins    AVOID:  E-Mycins      Kwell  Keflex  Macrobid/Macrodantin   Diarrhea:  Penicillin      Kao-Pectate  Zithromax      Imodium AD         PUSH FLUIDS AVOID:       Cipro  Fever:  Tetracycline      Tylenol (Regular or Extra  Minocycline       Strength)  Levaquin      Extra Strength-Do not          Exceed 8 tabs/24 hrs Caffeine:        '200mg'$ /day (equiv. To 1 cup of coffee or  approx. 3 12 oz sodas)         Gas: Cold/Hayfever:       Gas-X  Benadryl      Mylicon  Claritin       Phazyme  **Claritin-D        Chlor-Trimeton    Headaches:  Dimetapp      ASA-Free Excedrin  Drixoral-Non-Drowsy     Cold Compress  Mucinex (Guaifenasin)     Tylenol (Regular or Extra  Sudafed/Sudafed-12 Hour     Strength)  **Sudafed PE Pseudoephedrine   Tylenol Cold & Sinus     Vicks Vapor Rub  Zyrtec  **AVOID if Problems With Blood Pressure         Heartburn: Avoid lying down for at least 1 hour after meals  Aciphex      Maalox     Rash:  Milk of Magnesia     Benadryl    Mylanta       1% Hydrocortisone Cream  Pepcid  Pepcid Complete   Sleep Aids:  Prevacid      Ambien   Prilosec       Benadryl  Rolaids       Chamomile Tea  Tums (Limit 4/day)     Unisom         Tylenol PM         Warm milk-add vanilla or  Hemorrhoids:       Sugar for taste  Anusol/Anusol H.C.  (RX: Analapram 2.5%)  Sugar Substitutes:  Hydrocortisone  OTC     Ok in moderation  Preparation H      Tucks        Vaseline lotion applied to tissue with wiping    Herpes:     Throat:  Acyclovir      Oragel  Famvir  Valtrex     Vaccines:         Flu Shot Leg Cramps:       *Gardasil  Benadryl      Hepatitis A         Hepatitis B Nasal Spray:       Pneumovax  Saline Nasal Spray     Polio Booster         Tetanus Nausea:       Tuberculosis test or PPD  Vitamin B6 25 mg TID   AVOID:    Dramamine      *Gardasil  Emetrol       Live Poliovirus  Ginger Root 250 mg QID    MMR (measles, mumps &  High Complex Carbs @ Bedtime    rebella)  Sea Bands-Accupressure    Varicella (Chickenpox)  Unisom 1/2 tab TID     *No known complications           If received before Pain:         Known pregnancy;   Darvocet       Resume series after  Lortab        Delivery  Percocet    Yeast:   Tramadol      Femstat  Tylenol 3      Gyne-lotrimin  Ultram  Monistat  Vicodin           MISC:         All Sunscreens           Hair Coloring/highlights          Insect Repellant's          (Including DEET)         Mystic Tans

## 2020-12-14 ENCOUNTER — Encounter: Payer: Self-pay | Admitting: Obstetrics and Gynecology

## 2020-12-14 ENCOUNTER — Other Ambulatory Visit: Payer: Self-pay

## 2020-12-14 ENCOUNTER — Ambulatory Visit (INDEPENDENT_AMBULATORY_CARE_PROVIDER_SITE_OTHER): Payer: BC Managed Care – PPO | Admitting: Obstetrics and Gynecology

## 2020-12-14 VITALS — BP 120/85 | HR 112 | Wt 287.4 lb

## 2020-12-14 DIAGNOSIS — O26893 Other specified pregnancy related conditions, third trimester: Secondary | ICD-10-CM

## 2020-12-14 DIAGNOSIS — Z23 Encounter for immunization: Secondary | ICD-10-CM | POA: Diagnosis not present

## 2020-12-14 DIAGNOSIS — O99210 Obesity complicating pregnancy, unspecified trimester: Secondary | ICD-10-CM

## 2020-12-14 DIAGNOSIS — Z3A32 32 weeks gestation of pregnancy: Secondary | ICD-10-CM

## 2020-12-14 DIAGNOSIS — Z3483 Encounter for supervision of other normal pregnancy, third trimester: Secondary | ICD-10-CM

## 2020-12-14 DIAGNOSIS — O34219 Maternal care for unspecified type scar from previous cesarean delivery: Secondary | ICD-10-CM

## 2020-12-14 DIAGNOSIS — O09523 Supervision of elderly multigravida, third trimester: Secondary | ICD-10-CM | POA: Insufficient documentation

## 2020-12-14 DIAGNOSIS — R102 Pelvic and perineal pain: Secondary | ICD-10-CM

## 2020-12-14 LAB — POCT URINALYSIS DIPSTICK OB
Bilirubin, UA: NEGATIVE
Blood, UA: NEGATIVE
Glucose, UA: NEGATIVE
Ketones, UA: NEGATIVE
Leukocytes, UA: NEGATIVE
Nitrite, UA: NEGATIVE
POC,PROTEIN,UA: NEGATIVE
Spec Grav, UA: 1.01 (ref 1.010–1.025)
Urobilinogen, UA: 0.2 E.U./dL
pH, UA: 6.5 (ref 5.0–8.0)

## 2020-12-14 NOTE — Progress Notes (Signed)
ROB: Patient notes an increase in pelvic pressure lately. Just bought a pregnancy girdle.  Is scheduled for growth scan for obesity. Flu vaccine given today. 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 Discussed pain management in labor, desires epidural. RTC in 2 weeks.  Will need to begin NSTs at 36 weeks for BMI.

## 2020-12-14 NOTE — Progress Notes (Signed)
   OB-Pt present for routine prenatal care. Pt stated fetal movement present; no contractions present; no vaginal bleeding and no changes in vaginal discharge.     Pt c/o lower abd pain/pressure. Flu vaccine completed.

## 2020-12-24 ENCOUNTER — Other Ambulatory Visit: Payer: Self-pay | Admitting: Maternal & Fetal Medicine

## 2020-12-24 DIAGNOSIS — O09813 Supervision of pregnancy resulting from assisted reproductive technology, third trimester: Secondary | ICD-10-CM

## 2020-12-24 DIAGNOSIS — O09523 Supervision of elderly multigravida, third trimester: Secondary | ICD-10-CM

## 2020-12-24 DIAGNOSIS — O99213 Obesity complicating pregnancy, third trimester: Secondary | ICD-10-CM

## 2020-12-27 NOTE — Patient Instructions (Signed)

## 2020-12-28 ENCOUNTER — Ambulatory Visit (INDEPENDENT_AMBULATORY_CARE_PROVIDER_SITE_OTHER): Payer: BC Managed Care – PPO | Admitting: Obstetrics and Gynecology

## 2020-12-28 ENCOUNTER — Encounter: Payer: Self-pay | Admitting: Obstetrics and Gynecology

## 2020-12-28 ENCOUNTER — Other Ambulatory Visit: Payer: Self-pay

## 2020-12-28 VITALS — BP 122/84 | HR 108 | Wt 290.5 lb

## 2020-12-28 DIAGNOSIS — Z3A35 35 weeks gestation of pregnancy: Secondary | ICD-10-CM

## 2020-12-28 DIAGNOSIS — O99210 Obesity complicating pregnancy, unspecified trimester: Secondary | ICD-10-CM

## 2020-12-28 DIAGNOSIS — O09523 Supervision of elderly multigravida, third trimester: Secondary | ICD-10-CM

## 2020-12-28 LAB — POCT URINALYSIS DIPSTICK OB
Bilirubin, UA: NEGATIVE
Blood, UA: NEGATIVE
Glucose, UA: NEGATIVE
Ketones, UA: NEGATIVE
Leukocytes, UA: NEGATIVE
Nitrite, UA: NEGATIVE
POC,PROTEIN,UA: NEGATIVE
Spec Grav, UA: 1.025 (ref 1.010–1.025)
Urobilinogen, UA: 0.2 E.U./dL
pH, UA: 6 (ref 5.0–8.0)

## 2020-12-28 NOTE — Progress Notes (Signed)
ROB: Denies ANY problems.  Reports daily fetal movement.  Cultures next visit.  Early induction for BMI discussed.  Anesthesia consult if BMI over 45 discussed.  Ultrasound at [facility] next week for fetal growth.

## 2020-12-28 NOTE — Progress Notes (Signed)
OB-Pt present for routine prenatal care. Pt stated that she was doing well. No problems to report.

## 2020-12-30 ENCOUNTER — Other Ambulatory Visit: Payer: Self-pay

## 2021-01-04 ENCOUNTER — Other Ambulatory Visit: Payer: Self-pay

## 2021-01-04 ENCOUNTER — Ambulatory Visit: Payer: BC Managed Care – PPO | Attending: Obstetrics

## 2021-01-04 DIAGNOSIS — O09523 Supervision of elderly multigravida, third trimester: Secondary | ICD-10-CM | POA: Diagnosis not present

## 2021-01-04 DIAGNOSIS — O09813 Supervision of pregnancy resulting from assisted reproductive technology, third trimester: Secondary | ICD-10-CM | POA: Insufficient documentation

## 2021-01-04 DIAGNOSIS — Z3A35 35 weeks gestation of pregnancy: Secondary | ICD-10-CM | POA: Diagnosis not present

## 2021-01-04 DIAGNOSIS — E669 Obesity, unspecified: Secondary | ICD-10-CM | POA: Insufficient documentation

## 2021-01-04 DIAGNOSIS — O99213 Obesity complicating pregnancy, third trimester: Secondary | ICD-10-CM | POA: Diagnosis present

## 2021-01-11 ENCOUNTER — Other Ambulatory Visit: Payer: BC Managed Care – PPO

## 2021-01-11 ENCOUNTER — Ambulatory Visit (INDEPENDENT_AMBULATORY_CARE_PROVIDER_SITE_OTHER): Payer: BC Managed Care – PPO | Admitting: Obstetrics and Gynecology

## 2021-01-11 ENCOUNTER — Other Ambulatory Visit: Payer: Self-pay

## 2021-01-11 ENCOUNTER — Encounter: Payer: Self-pay | Admitting: Obstetrics and Gynecology

## 2021-01-11 VITALS — BP 118/81 | HR 123 | Wt 289.4 lb

## 2021-01-11 DIAGNOSIS — O99210 Obesity complicating pregnancy, unspecified trimester: Secondary | ICD-10-CM

## 2021-01-11 DIAGNOSIS — O34219 Maternal care for unspecified type scar from previous cesarean delivery: Secondary | ICD-10-CM

## 2021-01-11 DIAGNOSIS — Z6841 Body Mass Index (BMI) 40.0 and over, adult: Secondary | ICD-10-CM | POA: Diagnosis not present

## 2021-01-11 DIAGNOSIS — O09523 Supervision of elderly multigravida, third trimester: Secondary | ICD-10-CM

## 2021-01-11 DIAGNOSIS — Z3A36 36 weeks gestation of pregnancy: Secondary | ICD-10-CM

## 2021-01-11 DIAGNOSIS — Z3685 Encounter for antenatal screening for Streptococcus B: Secondary | ICD-10-CM

## 2021-01-11 DIAGNOSIS — Z3483 Encounter for supervision of other normal pregnancy, third trimester: Secondary | ICD-10-CM

## 2021-01-11 LAB — POCT URINALYSIS DIPSTICK OB
Bilirubin, UA: NEGATIVE
Blood, UA: NEGATIVE
Glucose, UA: NEGATIVE
Ketones, UA: NEGATIVE
Leukocytes, UA: NEGATIVE
Nitrite, UA: NEGATIVE
Spec Grav, UA: 1.025 (ref 1.010–1.025)
Urobilinogen, UA: 0.2 E.U./dL
pH, UA: 6 (ref 5.0–8.0)

## 2021-01-11 NOTE — Patient Instructions (Signed)
Signs and Symptoms of Labor Labor is the body's natural process of moving the baby and the placenta out of the uterus. The process of labor usually starts when the baby is full-term, between 37 and 40 weeks of pregnancy. Signs and symptoms that you are close to going into labor As your body prepares for labor and the birth of your baby, you may notice the following symptoms in the weeks and days before true labor starts: Passing a small amount of thick, bloody mucus from your vagina. This is called normal bloody show or losing your mucus plug. This may happen more than a week before labor begins, or right before labor begins, as the opening of the cervix starts to widen (dilate). For some women, the entire mucus plug passes at once. For others, pieces of the mucus plug may gradually pass over several days. Your baby moving (dropping) lower in your pelvis to get into position for birth (lightening). When this happens, you may feel more pressure on your bladder and pelvic bone and less pressure on your ribs. This may make it easier to breathe. It may also cause you to need to urinate more often and have problems with bowel movements. Having "practice contractions," also called Braxton Hicks contractions or false labor. These occur at irregular (unevenly spaced) intervals that are more than 10 minutes apart. False labor contractions are common after exercise or sexual activity. They will stop if you change position, rest, or drink fluids. These contractions are usually mild and do not get stronger over time. They may feel like: A backache or back pain. Mild cramps, similar to menstrual cramps. Tightening or pressure in your abdomen. Other early symptoms include: Nausea or loss of appetite. Diarrhea. Having a sudden burst of energy, or feeling very tired. Mood changes. Having trouble sleeping. Signs and symptoms that labor has begun Signs that you are in labor may include: Having contractions that come  at regular (evenly spaced) intervals and increase in intensity. This may feel like more intense tightening or pressure in your abdomen that moves to your back. Contractions may also feel like rhythmic pain in your upper thighs or back that comes and goes at regular intervals. For first-time mothers, this change in intensity of contractions often occurs at a more gradual pace. Women who have given birth before may notice a more rapid progression of contraction changes. Feeling pressure in the vaginal area. Your water breaking (rupture of membranes). This is when the sac of fluid that surrounds your baby breaks. Fluid leaking from your vagina may be clear or blood-tinged. Labor usually starts within 24 hours of your water breaking, but it may take longer to begin. Some women may feel a sudden gush of fluid. Others notice that their underwear repeatedly becomes damp. Follow these instructions at home:  When labor starts, or if your water breaks, call your health care provider or nurse care line. Based on your situation, they will determine when you should go in for an exam. During early labor, you may be able to rest and manage symptoms at home. Some strategies to try at home include: Breathing and relaxation techniques. Taking a warm bath or shower. Listening to music. Using a heating pad on the lower back for pain. If you are directed to use heat: Place a towel between your skin and the heat source. Leave the heat on for 20-30 minutes. Remove the heat if your skin turns bright red. This is especially important if you are unable to   feel pain, heat, or cold. You may have a greater risk of getting burned. Contact a health care provider if: Your labor has started. Your water breaks. Get help right away if: You have painful, regular contractions that are 5 minutes apart or less. Labor starts before you are [redacted] weeks along in your pregnancy. You have a fever. You have bright red blood coming from  your vagina. You do not feel your baby moving. You have a severe headache with or without vision problems. You have severe nausea, vomiting, or diarrhea. You have chest pain or shortness of breath. These symptoms may represent a serious problem that is an emergency. Do not wait to see if the symptoms will go away. Get medical help right away. Call your local emergency services (911 in the [place] Do not drive yourself to the hospital. Summary Labor is your body's natural process of moving your baby and the placenta out of your uterus. The process of labor usually starts when your baby is full-term, between 37 and 40 weeks of pregnancy. When labor starts, or if your water breaks, call your health care provider or nurse care line. Based on your situation, they will determine when you should go in for an exam. This information is not intended to replace advice given to you by your health care provider. Make sure you discuss any questions you have with your health care provider. Document Revised: 01/03/2020 Document Reviewed: 01/03/2020 Elsevier Patient Education  2022 Elsevier Inc.  

## 2021-01-11 NOTE — Addendum Note (Signed)
Addended by: Tommie Raymond on: 01/11/2021 05:01 PM   Modules accepted: Orders

## 2021-01-11 NOTE — Progress Notes (Signed)
ROB: Patient doing well, no major complaints. For 36 week cultures today.  Had growth scan last week, 81%ile. NST performed today was reviewed and was found to be reactive.  Continue recommended antenatal testing and prenatal care. Can discuss IOL at next visit.    NONSTRESS TEST INTERPRETATION  INDICATIONS: Obesity and  AMA  FHR baseline: 130 bpm RESULTS:Reactive COMMENTS: Uterine irritability with occasional ctx   PLAN: 1. Continue fetal kick counts twice a day. 2. Continue antepartum testing as scheduled-weekly

## 2021-01-14 LAB — GC/CHLAMYDIA PROBE AMP
Chlamydia trachomatis, NAA: NEGATIVE
Neisseria Gonorrhoeae by PCR: NEGATIVE

## 2021-01-16 LAB — STREP GP B NAA+RFLX: Strep Gp B NAA+Rflx: POSITIVE — AB

## 2021-01-16 LAB — STREP GP B SUSCEPTIBILITY

## 2021-01-20 ENCOUNTER — Ambulatory Visit (INDEPENDENT_AMBULATORY_CARE_PROVIDER_SITE_OTHER): Payer: BC Managed Care – PPO | Admitting: Obstetrics and Gynecology

## 2021-01-20 ENCOUNTER — Other Ambulatory Visit: Payer: BC Managed Care – PPO

## 2021-01-20 ENCOUNTER — Other Ambulatory Visit: Payer: Self-pay

## 2021-01-20 VITALS — BP 122/88 | HR 92 | Wt 292.5 lb

## 2021-01-20 DIAGNOSIS — Z3483 Encounter for supervision of other normal pregnancy, third trimester: Secondary | ICD-10-CM | POA: Diagnosis not present

## 2021-01-20 DIAGNOSIS — Z3A37 37 weeks gestation of pregnancy: Secondary | ICD-10-CM

## 2021-01-20 LAB — POCT URINALYSIS DIPSTICK OB
Bilirubin, UA: NEGATIVE
Blood, UA: NEGATIVE
Glucose, UA: NEGATIVE
Ketones, UA: NEGATIVE
Leukocytes, UA: NEGATIVE
Nitrite, UA: NEGATIVE
POC,PROTEIN,UA: NEGATIVE
Spec Grav, UA: 1.015 (ref 1.010–1.025)
Urobilinogen, UA: 0.2 E.U./dL
pH, UA: 7.5 (ref 5.0–8.0)

## 2021-01-20 NOTE — Addendum Note (Signed)
Addended by: Elonda Husky on: 01/20/2021 10:32 AM   Modules accepted: Orders, SmartSet

## 2021-01-20 NOTE — Progress Notes (Signed)
ROB: She says she has occasional contractions especially at night.  Signs and symptoms of labor reviewed.  Induction scheduled for increased BMI - 11/8 MN.  Discussed induction.  NST reactive today . NST next week.  Patient will need COVID testing prior to induction.

## 2021-01-27 ENCOUNTER — Encounter: Payer: Self-pay | Admitting: Obstetrics and Gynecology

## 2021-01-27 ENCOUNTER — Other Ambulatory Visit: Payer: BC Managed Care – PPO

## 2021-01-27 ENCOUNTER — Ambulatory Visit (INDEPENDENT_AMBULATORY_CARE_PROVIDER_SITE_OTHER): Payer: BC Managed Care – PPO | Admitting: Obstetrics and Gynecology

## 2021-01-27 ENCOUNTER — Other Ambulatory Visit: Payer: Self-pay

## 2021-01-27 VITALS — BP 155/90 | HR 108 | Wt 295.8 lb

## 2021-01-27 DIAGNOSIS — O99210 Obesity complicating pregnancy, unspecified trimester: Secondary | ICD-10-CM | POA: Diagnosis not present

## 2021-01-27 DIAGNOSIS — O09523 Supervision of elderly multigravida, third trimester: Secondary | ICD-10-CM

## 2021-01-27 DIAGNOSIS — Z6841 Body Mass Index (BMI) 40.0 and over, adult: Secondary | ICD-10-CM

## 2021-01-27 DIAGNOSIS — O34219 Maternal care for unspecified type scar from previous cesarean delivery: Secondary | ICD-10-CM

## 2021-01-27 DIAGNOSIS — E669 Obesity, unspecified: Secondary | ICD-10-CM

## 2021-01-27 DIAGNOSIS — Z3483 Encounter for supervision of other normal pregnancy, third trimester: Secondary | ICD-10-CM

## 2021-01-27 DIAGNOSIS — Z3A39 39 weeks gestation of pregnancy: Secondary | ICD-10-CM

## 2021-01-27 LAB — POCT URINALYSIS DIPSTICK OB
Bilirubin, UA: NEGATIVE
Blood, UA: NEGATIVE
Glucose, UA: NEGATIVE
Ketones, UA: NEGATIVE
Leukocytes, UA: NEGATIVE
Nitrite, UA: NEGATIVE
POC,PROTEIN,UA: NEGATIVE
Spec Grav, UA: 1.01 (ref 1.010–1.025)
Urobilinogen, UA: 0.2 E.U./dL
pH, UA: 6 (ref 5.0–8.0)

## 2021-01-27 NOTE — Progress Notes (Signed)
ROB: Patient denies complaints today. Notes she is getting ready for IOL, is "nervous but excited". IOL time change to 0500 instead of midnight.   Advised on need for COVID testing prior to admission. NST performed today was reviewed and was found to be reactive. BP today elevated but repeat wnl.    NONSTRESS TEST INTERPRETATION  INDICATIONS: Obesity  FHR baseline: 125 RESULTS:Reactive COMMENTS: Uterine irritability   PLAN: 1. Continue fetal kick counts twice a day. 2. Scheduled for IOL on 02/01/2021.

## 2021-01-27 NOTE — Patient Instructions (Signed)
Labor Induction ?Labor induction is when steps are taken to cause a pregnant woman to begin the labor process. Most women go into labor on their own between 37 weeks and 42 weeks of pregnancy. When this does not happen, or when there is a medical need for labor to begin, steps may be taken to induce, or bring on, labor. ?Labor induction causes a pregnant woman's uterus to contract. It also causes the cervix to soften (ripen), open (dilate), and thin out. Usually, labor is not induced before 39 weeks of pregnancy unless there is a medical reason to do so. ?When is labor induction considered? ?Labor induction may be right for you if: ?Your pregnancy lasts longer than 41 to 42 weeks. ?Your placenta is separating from your uterus (placental abruption). ?You have a rupture of membranes and your labor does not begin. ?You have health problems, like diabetes or high blood pressure (preeclampsia) during your pregnancy. ?Your baby has stopped growing or does not have enough amniotic fluid. ?Before labor induction begins, your health care provider will consider the following factors: ?Your medical condition and the baby's condition. ?How many weeks you have been pregnant. ?How mature the baby's lungs are. ?The condition of your cervix. ?The position of the baby. ?The size of your birth canal. ?Tell a health care provider about: ?Any allergies you have. ?All medicines you are taking, including vitamins, herbs, eye drops, creams, and over-the-counter medicines. ?Any problems you or your family members have had with anesthetic medicines. ?Any surgeries you have had. ?Any blood disorders you have. ?Any medical conditions you have. ?What are the risks? ?Generally, this is a safe procedure. However, problems may occur, including: ?Failed induction. ?Changes in fetal heart rate, such as being too high, too low, or irregular (erratic). ?Infection in the mother or the baby. ?Increased risk of having a cesarean delivery. ?Breaking off  (abruption) of the placenta from the uterus. This is rare. ?Rupture of the uterus. This is very rare. ?Your baby could fail to get enough blood flow or oxygen. This can be life-threatening. ?When induction is needed for medical reasons, the benefits generally outweigh the risks. ?What happens during the procedure? ?During the procedure, your health care provider will use one of these methods to induce labor: ?Stripping the membranes. In this method, the amniotic sac tissue is gently separated from the cervix. This causes the following to happen: ?Your cervix stretches, which in turn causes the release of prostaglandins. ?Prostaglandins induce labor and cause the uterus to contract. ?This procedure is often done in an office visit. You will be sent home to wait for contractions to begin. ?Prostaglandin medicine. This medicine starts contractions and causes the cervix to dilate and ripen. This can be taken by mouth (orally) or by being inserted into the vagina (suppository). ?Inserting a small, thin tube (catheter) with a balloon into the vagina and then expanding the balloon with water to dilate the cervix. ?Breaking the water. In this method, a small instrument is used to make a small hole in the amniotic sac. This eventually causes the amniotic sac to break. Contractions should begin within a few hours. ?Medicine to trigger or strengthen contractions. This medicine is given through an IV that is inserted into a vein in your arm. ?This procedure may vary among health care providers and hospitals. ?Where to find more information ?March of Dimes: www.marchofdimes.org ?The American College of Obstetricians and Gynecologists: www.acog.org ?Summary ?Labor induction causes a pregnant woman's uterus to contract. It also causes the cervix   to soften (ripen), open (dilate), and thin out. ?Labor is usually not induced before 39 weeks of pregnancy unless there is a medical reason to do so. ?When induction is needed for medical  reasons, the benefits generally outweigh the risks. ?Talk with your health care provider about which methods of labor induction are right for you. ?This information is not intended to replace advice given to you by your health care provider. Make sure you discuss any questions you have with your health care provider. ?Document Revised: 12/25/2019 Document Reviewed: 12/25/2019 ?Elsevier Patient Education ? 2022 Elsevier Inc. ? ?

## 2021-01-27 NOTE — Progress Notes (Signed)
OB-pt present for routine prenatal care and NST. Pt stated that she was doing well no issues at this time.

## 2021-01-28 ENCOUNTER — Other Ambulatory Visit: Admission: RE | Admit: 2021-01-28 | Payer: BC Managed Care – PPO | Source: Ambulatory Visit

## 2021-01-28 ENCOUNTER — Other Ambulatory Visit
Admission: RE | Admit: 2021-01-28 | Discharge: 2021-01-28 | Disposition: A | Discharge status: Home / Self Care | Payer: BC Managed Care – PPO | Source: Ambulatory Visit | Attending: Obstetrics and Gynecology | Admitting: Obstetrics and Gynecology

## 2021-01-28 DIAGNOSIS — Z20822 Contact with and (suspected) exposure to covid-19: Secondary | ICD-10-CM | POA: Insufficient documentation

## 2021-01-28 LAB — SARS CORONAVIRUS 2 (TAT 6-24 HRS): SARS Coronavirus 2: NEGATIVE

## 2021-02-01 ENCOUNTER — Inpatient Hospital Stay: Payer: BC Managed Care – PPO | Admitting: Anesthesiology

## 2021-02-01 ENCOUNTER — Inpatient Hospital Stay
Admission: EM | Admit: 2021-02-01 | Discharge: 2021-02-03 | DRG: 806 | Disposition: A | Discharge status: Home / Self Care | Payer: BC Managed Care – PPO | Attending: Obstetrics and Gynecology | Admitting: Obstetrics and Gynecology

## 2021-02-01 ENCOUNTER — Other Ambulatory Visit: Admission: RE | Admit: 2021-02-01 | Payer: BC Managed Care – PPO | Source: Ambulatory Visit

## 2021-02-01 ENCOUNTER — Encounter: Payer: Self-pay | Admitting: Obstetrics and Gynecology

## 2021-02-01 ENCOUNTER — Other Ambulatory Visit: Payer: Self-pay

## 2021-02-01 DIAGNOSIS — O26899 Other specified pregnancy related conditions, unspecified trimester: Secondary | ICD-10-CM

## 2021-02-01 DIAGNOSIS — Z87440 Personal history of urinary (tract) infections: Secondary | ICD-10-CM

## 2021-02-01 DIAGNOSIS — O34219 Maternal care for unspecified type scar from previous cesarean delivery: Secondary | ICD-10-CM | POA: Diagnosis present

## 2021-02-01 DIAGNOSIS — O09813 Supervision of pregnancy resulting from assisted reproductive technology, third trimester: Secondary | ICD-10-CM | POA: Diagnosis not present

## 2021-02-01 DIAGNOSIS — Z6791 Unspecified blood type, Rh negative: Secondary | ICD-10-CM | POA: Diagnosis not present

## 2021-02-01 DIAGNOSIS — O09523 Supervision of elderly multigravida, third trimester: Secondary | ICD-10-CM

## 2021-02-01 DIAGNOSIS — O99824 Streptococcus B carrier state complicating childbirth: Secondary | ICD-10-CM | POA: Diagnosis present

## 2021-02-01 DIAGNOSIS — O99214 Obesity complicating childbirth: Principal | ICD-10-CM | POA: Diagnosis present

## 2021-02-01 DIAGNOSIS — D649 Anemia, unspecified: Secondary | ICD-10-CM | POA: Diagnosis not present

## 2021-02-01 DIAGNOSIS — Z3A39 39 weeks gestation of pregnancy: Secondary | ICD-10-CM

## 2021-02-01 DIAGNOSIS — O26893 Other specified pregnancy related conditions, third trimester: Secondary | ICD-10-CM | POA: Diagnosis present

## 2021-02-01 DIAGNOSIS — O99213 Obesity complicating pregnancy, third trimester: Secondary | ICD-10-CM

## 2021-02-01 DIAGNOSIS — Z880 Allergy status to penicillin: Secondary | ICD-10-CM | POA: Diagnosis not present

## 2021-02-01 DIAGNOSIS — R030 Elevated blood-pressure reading, without diagnosis of hypertension: Secondary | ICD-10-CM | POA: Diagnosis present

## 2021-02-01 DIAGNOSIS — O9081 Anemia of the puerperium: Secondary | ICD-10-CM | POA: Diagnosis not present

## 2021-02-01 DIAGNOSIS — O09899 Supervision of other high risk pregnancies, unspecified trimester: Secondary | ICD-10-CM

## 2021-02-01 DIAGNOSIS — Z8659 Personal history of other mental and behavioral disorders: Secondary | ICD-10-CM

## 2021-02-01 DIAGNOSIS — D62 Acute posthemorrhagic anemia: Secondary | ICD-10-CM | POA: Diagnosis not present

## 2021-02-01 DIAGNOSIS — O9882 Other maternal infectious and parasitic diseases complicating childbirth: Secondary | ICD-10-CM | POA: Diagnosis not present

## 2021-02-01 DIAGNOSIS — E669 Obesity, unspecified: Secondary | ICD-10-CM | POA: Diagnosis present

## 2021-02-01 DIAGNOSIS — O9902 Anemia complicating childbirth: Secondary | ICD-10-CM | POA: Diagnosis not present

## 2021-02-01 DIAGNOSIS — B951 Streptococcus, group B, as the cause of diseases classified elsewhere: Secondary | ICD-10-CM | POA: Diagnosis not present

## 2021-02-01 DIAGNOSIS — O99013 Anemia complicating pregnancy, third trimester: Secondary | ICD-10-CM

## 2021-02-01 DIAGNOSIS — Z3490 Encounter for supervision of normal pregnancy, unspecified, unspecified trimester: Secondary | ICD-10-CM

## 2021-02-01 LAB — CBC
HCT: 34.3 % — ABNORMAL LOW (ref 36.0–46.0)
Hemoglobin: 11.2 g/dL — ABNORMAL LOW (ref 12.0–15.0)
MCH: 27.2 pg (ref 26.0–34.0)
MCHC: 32.7 g/dL (ref 30.0–36.0)
MCV: 83.3 fL (ref 80.0–100.0)
Platelets: 269 10*3/uL (ref 150–400)
RBC: 4.12 MIL/uL (ref 3.87–5.11)
RDW: 15.2 % (ref 11.5–15.5)
WBC: 11.6 10*3/uL — ABNORMAL HIGH (ref 4.0–10.5)
nRBC: 0 % (ref 0.0–0.2)

## 2021-02-01 LAB — TYPE AND SCREEN
ABO/RH(D): B NEG
Antibody Screen: POSITIVE

## 2021-02-01 MED ORDER — LIDOCAINE HCL (PF) 1 % IJ SOLN: 30.0000 mL | INTRAMUSCULAR | Status: DC | PRN

## 2021-02-01 MED ORDER — OXYTOCIN 10 UNIT/ML IJ SOLN
INTRAMUSCULAR | Status: AC
  Filled 2021-02-01: qty 2

## 2021-02-01 MED ORDER — EPHEDRINE 5 MG/ML INJ: 10.0000 mg | INTRAVENOUS | Status: DC | PRN

## 2021-02-01 MED ORDER — MISOPROSTOL 200 MCG PO TABS
ORAL_TABLET | ORAL | Status: AC
  Filled 2021-02-01: qty 4

## 2021-02-01 MED ORDER — SODIUM CHLORIDE 0.9 % IV SOLN
2.0000 g | Freq: Once | INTRAVENOUS | Status: AC
  Administered 2021-02-01: 2 g via INTRAVENOUS
  Filled 2021-02-01: qty 2000

## 2021-02-01 MED ORDER — ONDANSETRON HCL 4 MG/2ML IJ SOLN: 4.0000 mg | Freq: Four times a day (QID) | INTRAMUSCULAR | Status: DC | PRN

## 2021-02-01 MED ORDER — LACTATED RINGERS IV SOLN: INTRAVENOUS | Status: DC

## 2021-02-01 MED ORDER — LACTATED RINGERS IV SOLN
500.0000 mL | Freq: Once | INTRAVENOUS | Status: AC
  Administered 2021-02-01: 500 mL via INTRAVENOUS

## 2021-02-01 MED ORDER — LIDOCAINE HCL (PF) 1 % IJ SOLN
INTRAMUSCULAR | Status: DC | PRN
  Administered 2021-02-01 (×2): 1 mL via SUBCUTANEOUS

## 2021-02-01 MED ORDER — OXYTOCIN-SODIUM CHLORIDE 30-0.9 UT/500ML-% IV SOLN
2.5000 [IU]/h | INTRAVENOUS | Status: DC
  Administered 2021-02-02: 2.5 [IU]/h via INTRAVENOUS
  Filled 2021-02-01: qty 500

## 2021-02-01 MED ORDER — SOD CITRATE-CITRIC ACID 500-334 MG/5ML PO SOLN: 30.0000 mL | ORAL | Status: DC | PRN

## 2021-02-01 MED ORDER — ACETAMINOPHEN 325 MG PO TABS: 650.0000 mg | ORAL_TABLET | ORAL | Status: DC | PRN

## 2021-02-01 MED ORDER — LIDOCAINE HCL (PF) 1 % IJ SOLN
INTRAMUSCULAR | Status: AC
  Filled 2021-02-01: qty 30

## 2021-02-01 MED ORDER — FENTANYL-BUPIVACAINE-NACL 0.5-0.125-0.9 MG/250ML-% EP SOLN
EPIDURAL | Status: AC
  Filled 2021-02-01: qty 250

## 2021-02-01 MED ORDER — FENTANYL-BUPIVACAINE-NACL 0.5-0.125-0.9 MG/250ML-% EP SOLN
12.0000 mL/h | EPIDURAL | Status: DC | PRN
  Administered 2021-02-01 – 2021-02-02 (×2): 12 mL/h via EPIDURAL
  Filled 2021-02-01: qty 250

## 2021-02-01 MED ORDER — AMMONIA AROMATIC IN INHA
RESPIRATORY_TRACT | Status: AC
  Filled 2021-02-01: qty 10

## 2021-02-01 MED ORDER — SODIUM CHLORIDE 0.9 % IV SOLN
1.0000 g | INTRAVENOUS | Status: DC
  Administered 2021-02-01 – 2021-02-02 (×5): 1 g via INTRAVENOUS
  Filled 2021-02-01 (×6): qty 1000

## 2021-02-01 MED ORDER — PHENYLEPHRINE 40 MCG/ML (10ML) SYRINGE FOR IV PUSH (FOR BLOOD PRESSURE SUPPORT): 80.0000 ug | PREFILLED_SYRINGE | INTRAVENOUS | Status: DC | PRN

## 2021-02-01 MED ORDER — LACTATED RINGERS IV SOLN: 500.0000 mL | INTRAVENOUS | Status: DC | PRN

## 2021-02-01 MED ORDER — OXYTOCIN BOLUS FROM INFUSION
333.0000 mL | Freq: Once | INTRAVENOUS | Status: AC
  Administered 2021-02-02: 333 mL via INTRAVENOUS

## 2021-02-01 MED ORDER — LIDOCAINE-EPINEPHRINE (PF) 1.5 %-1:200000 IJ SOLN
INTRAMUSCULAR | Status: DC | PRN
  Administered 2021-02-01: 3 mL via EPIDURAL

## 2021-02-01 MED ORDER — OXYTOCIN-SODIUM CHLORIDE 30-0.9 UT/500ML-% IV SOLN
1.0000 m[IU]/min | INTRAVENOUS | Status: DC
  Administered 2021-02-01: 4 m[IU]/min via INTRAVENOUS
  Administered 2021-02-01: 2 m[IU]/min via INTRAVENOUS
  Administered 2021-02-02: 34 m[IU]/min via INTRAVENOUS
  Filled 2021-02-01: qty 1000

## 2021-02-01 MED ORDER — DIPHENHYDRAMINE HCL 50 MG/ML IJ SOLN: 12.5000 mg | INTRAMUSCULAR | Status: DC | PRN

## 2021-02-01 MED ORDER — SODIUM CHLORIDE 0.9 % IV SOLN
INTRAVENOUS | Status: DC | PRN
  Administered 2021-02-01 (×2): 5 mL via EPIDURAL

## 2021-02-01 MED ORDER — TERBUTALINE SULFATE 1 MG/ML IJ SOLN: 0.2500 mg | Freq: Once | INTRAMUSCULAR | Status: DC | PRN

## 2021-02-01 MED ORDER — BUTORPHANOL TARTRATE 1 MG/ML IJ SOLN: 1.0000 mg | INTRAMUSCULAR | Status: DC | PRN

## 2021-02-01 NOTE — H&P (Signed)
Obstetric History and Physical  Mary Bradford is a 35 y.o. G3P1011 with IUP at [redacted]w[redacted]d presenting for scheduled IOL for morbid obesity in pregnancy. Patient states she has been having no contractions,  no  vaginal bleeding, intact membranes, with active fetal movement.    Of note, patient is a TOLAC.  She had previous C-section due to breech presentation at term.  This is an IVF pregnancy.   Prenatal Course Source of Care: [facility] with onset of care at 10 weeks Pregnancy complications or risks: Patient Active Problem List   Diagnosis Date Noted   Term pregnancy 02/01/2021   Supervision of high risk elderly multigravida in third trimester 12/14/2020   Desires VBAC (vaginal birth after cesarean) trial 10/21/2020   Newborn product of in vitro fertilization (IVF) pregnancy 08/26/2020   Rh negative state in antepartum period 08/26/2020   History of GBS (group B streptococcus) UTI, currently pregnant 08/26/2020   Depression, major, single episode, mild (HCC) 04/04/2019   Family history of AML (acute myeloid leukemia) 04/04/2019   Migraines 03/07/2019   BMI 40.0-44.9, adult (HCC) 03/07/2019   Morbid obesity (HCC) 03/07/2019   S/P primary low transverse C-section 05/12/2017   She plans to breastfeed She desires  undecided method  for postpartum contraception.   Prenatal labs and studies: ABO, Rh: --/--/PENDING (11/08 4656) Antibody: PENDING (11/08 0741) Rubella: 2.93 (04/18 1009) RPR: Non Reactive (08/26 1118)  HBsAg: Negative (04/18 1009)  HIV: Non Reactive (04/18 1009)  GBS:--/Positive (10/18 1700) 1 hr Glucola  normal Genetic screening normal Anatomy US normal   Past Medical History:  Diagnosis Date   BMI 40.0-44.9, adult (HCC)    Depression    History of PCOS    Migraines    Miscarriage    2018   Newborn product of in vitro fertilization (IVF) pregnancy     Past Surgical History:  Procedure Laterality Date   CESAREAN SECTION N/A 05/12/2017    Procedure: CESAREAN SECTION;  Surgeon: Osborn Coho, MD;  Location: [facility] BIRTHING SUITES;  Service: Obstetrics;  Laterality: N/A;  RNFA Requested OPERATIVE ULTRASOUND BEFORE CESAREAN TO CONFIRM PRESENTATION   egg  retrieval     WISDOM TOOTH EXTRACTION      OB History  Gravida Para Term Preterm AB Living  3 1 1   1 1   SAB IAB Ectopic Multiple Live Births  1     0 1    # Outcome Date GA Lbr Len/2nd Weight Sex Delivery Anes PTL Lv  3 Current           2 Term 05/12/17 [redacted]w[redacted]d  3965 g M CS-LTranv Spinal  LIV  1 SAB             Social History   Socioeconomic History   Marital status: Married    Spouse name: [redacted]w[redacted]d   Number of children: 1   Years of education: Not on file   Highest education level: Not on file  Occupational History   Occupation: teacher    Comment: [employer]  Tobacco Use   Smoking status: Never   Smokeless tobacco: Never  Vaping Use   Vaping Use: Never used  Substance and Sexual Activity   Alcohol use: No   Drug use: No   Sexual activity: Yes    Birth control/protection: None  Other Topics Concern   Not on file  Social History Narrative   Not on file   Social Determinants of Health   Financial Resource Strain: Not on file  Food Insecurity: Not on file  Transportation Needs: Not on file  Physical Activity: Not on file  Stress: Not on file  Social Connections: Not on file    Family History  Problem Relation Age of Onset   Kidney Stones Mother    Hypertension Mother    Hypertension Father    Kidney Stones Father    Migraines Father    Diabetes Father    Irritable bowel syndrome Sister    Migraines Sister    Kidney disease Sister    Leukemia Sister    Heart disease Maternal Grandmother    Diabetes Maternal Grandmother    Heart failure Maternal Grandmother    Diabetes Maternal Grandfather    Dementia Maternal Grandfather    Non-Hodgkin's lymphoma Paternal Grandfather    Leukemia Paternal Grandfather    Hodgkin's lymphoma Paternal  Grandfather    Leukemia Maternal Uncle     Medications Prior to Admission  Medication Sig Dispense Refill Last Dose   aspirin EC 81 MG tablet Take 81 mg by mouth daily. Swallow whole.   01/31/2021   docusate sodium (COLACE) 100 MG capsule Take 100 mg by mouth 2 (two) times daily. As needed   01/31/2021   Prenatal Vit-Fe Fumarate-FA (PRENATAL VITAMIN) 27-0.8 MG TABS Take by mouth.   01/31/2021    Allergies  Allergen Reactions   Amoxicillin Other (See Comments)    Has patient had a PCN reaction causing immediate rash, facial/tongue/throat swelling, SOB or lightheadedness with hypotension: Unknown Has patient had a PCN reaction causing severe rash involving mucus membranes or skin necrosis: Unknown Has patient had a PCN reaction that required hospitalization: Unknown Has patient had a PCN reaction occurring within the last 10 years: No If all of the above answers are "NO", then may proceed with Cephalosporin use.     Review of Systems: Negative except for what is mentioned in HPI.  Physical Exam: BP (!) 139/95 (BP Location: Left Arm)   Pulse (!) 119   Temp 97.8 F (36.6 C) (Oral)   Resp 17   Ht 5\' 8"  (1.727 m)   Wt 135 kg   BMI 45.25 kg/m  CONSTITUTIONAL: Well-developed, well-nourished female in no acute distress.  HENT:  Normocephalic, atraumatic, External right and left ear normal. Oropharynx is clear and moist EYES: Conjunctivae and EOM are normal. Pupils are equal, round, and reactive to light. No scleral icterus.  NECK: Normal range of motion, supple, no masses SKIN: Skin is warm and dry. No rash noted. Not diaphoretic. No erythema. No pallor. NEUROLOGIC: Alert and oriented to person, place, and time. Normal reflexes, muscle tone coordination. No cranial nerve deficit noted. PSYCHIATRIC: Normal mood and affect. Normal behavior. Normal judgment and thought content. CARDIOVASCULAR: Normal heart rate noted, regular rhythm RESPIRATORY: Effort and breath sounds normal, no problems  with respiration noted ABDOMEN: Soft, nontender, nondistended, gravid. MUSCULOSKELETAL: Normal range of motion. No edema and no tenderness. 2+ distal pulses.  Cervical Exam: Dilatation 2-3cm (visibly on speculum)   Effacement 50%   Station OOP   Presentation: cephalic (confirmed with bedside ultrasound) FHT:  Baseline rate 135 bpm   Variability moderate  Accelerations present   Decelerations none Contractions: None   Pertinent Labs/Studies:   Results for orders placed or performed during the hospital encounter of 02/01/21 (from the past 24 hour(s))  CBC     Status: Abnormal   Collection Time: 02/01/21  7:41 AM  Result Value Ref Range   WBC 11.6 (H) 4.0 - 10.5 K/uL  RBC 4.12 3.87 - 5.11 MIL/uL   Hemoglobin 11.2 (L) 12.0 - 15.0 g/dL   HCT 62.1 (L) 30.8 - 65.7 %   MCV 83.3 80.0 - 100.0 fL   MCH 27.2 26.0 - 34.0 pg   MCHC 32.7 30.0 - 36.0 g/dL   RDW 84.6 96.2 - 95.2 %   Platelets 269 150 - 400 K/uL   nRBC 0.0 0.0 - 0.2 %  Type and screen     Status: None (Preliminary result)   Collection Time: 02/01/21  7:41 AM  Result Value Ref Range   ABO/RH(D) PENDING    Antibody Screen PENDING    Sample Expiration      02/04/2021,2359 Performed at [facility], [address], [place], [place] [address]     Assessment : Baila Rouse is a 35 y.o. G3P1011 at [redacted]w[redacted]d being admitted for induction of labor due to morbid obesity in pregnancy.  IVF pregnancy. H/o C-section desiring TOLAC. Rh negative.  Plan: Labor: Induction with Pitocin (low dose) and foley bulb (Cook's catheter placed as initial foley bulb was expelled) as ordered as per protocol. Analgesia as needed. FWB: Reassuring fetal heart tracing.  GBS positive, will treat with ampicillin.  Delivery plan: Hopeful for vaginal delivery    Hildred Laser, MD [facility]

## 2021-02-01 NOTE — Progress Notes (Addendum)
Intrapartum Progress Note  S: Patient with no complaints. Is s/p epidural.    O: Blood pressure (!) 117/53, pulse (!) 118, temperature 97.9 F (36.6 C), temperature source Oral, resp. rate 18, height 5\' 8"  (1.727 m), weight 135 kg, SpO2 98 %. Gen App: NAD, comfortable Abdomen: soft, gravid FHT: baseline 140 bpm.  Accels present.  Decels present - occasional early deceleration. moderate in degree variability.   Tocometer: contractions q 2-4 minutes, irregular Cervix: 6-6.5/80/-3. Bloody show noted. Arom'd at last check, clear fluid.  Extremities: Nontender, no edema.  Pitocin: 28 mIU  Labs: No new labs   Assessment:  1: SIUP at [redacted]w[redacted]d 2. Obesity in pregnancy (BMI 45) 3. IVF pregnancy 4. H/o C/S x 1 (breech presentation) 5. Mildly elevated BP, possible GHTN at term  Plan:  Will allow for pitocin rest for 1 hour, then resume at high dose.  2.   Category I tracing.  3. Continue monitoring BPs, intermittent mild elevations noted early however have normalized since epidural placement.    [redacted]w[redacted]d, MD 02/01/2021 10:13 PM

## 2021-02-01 NOTE — Progress Notes (Addendum)
Intrapartum Progress Note  S: Patient with no complaints. Noting some pelvic cramping, pain 2/10.   O: Blood pressure (!) 144/84, pulse (!) 111, temperature 97.8 F (36.6 C), temperature source Oral, resp. rate 18, height 5\' 8"  (1.727 m), weight 135 kg. Gen App: NAD, comfortable Abdomen: soft, gravid FHT: baseline 135 bpm.  Accels present.  Decels absent. moderate in degree variability.   Tocometer: contractions q 2-4 minutes Cervix: deferred. Patient ambulating Extremities: Nontender, no edema.  Pitocin: 10 mIU  Labs: No new labs   Assessment:  1: SIUP at [redacted]w[redacted]d 2. Obesity in pregnancy (BMI 45) 3. IVF pregnancy 4. H/o C/S x 1 (breech presentation) 5. Elevated BP  Plan:  1. Continue IOL with Pitocin.  2. Category I tracing.  3. Elevated BP noted, also another mild elevation recorded on admission. Will continue to monitor. Patient may be attempting to develop GHTN at term.   [redacted]w[redacted]d, MD 02/01/2021 1:41 PM

## 2021-02-01 NOTE — Anesthesia Preprocedure Evaluation (Signed)
Anesthesia Evaluation  Patient identified by MRN, date of birth, ID band Patient awake    Reviewed: Allergy & Precautions, NPO status , Patient's Chart, lab work & pertinent test results  History of Anesthesia Complications Negative for: history of anesthetic complications  Airway Mallampati: III  TM Distance: >3 FB Neck ROM: full    Dental  (+) Chipped   Pulmonary neg pulmonary ROS,    Pulmonary exam normal        Cardiovascular Exercise Tolerance: Good hypertension, Normal cardiovascular exam+ dysrhythmias      Neuro/Psych  Headaches,    GI/Hepatic negative GI ROS,   Endo/Other  Morbid obesity  Renal/GU   negative genitourinary   Musculoskeletal   Abdominal   Peds  Hematology negative hematology ROS (+)   Anesthesia Other Findings Patient reports chronic back pain 2/2 disk problems that sometimes radiates into her legs.  Past Medical History: No date: BMI 40.0-44.9, adult (HCC) No date: Depression No date: History of PCOS No date: Migraines No date: Miscarriage     Comment:  2018 No date: Newborn product of in vitro fertilization (IVF) pregnancy  Past Surgical History: 05/12/2017: CESAREAN SECTION; N/A     Comment:  Procedure: CESAREAN SECTION;  Surgeon: Osborn Coho,               MD;  Location: [facility] BIRTHING SUITES;  Service: Obstetrics;               Laterality: N/A;  RNFA Requested OPERATIVE ULTRASOUND               BEFORE CESAREAN TO CONFIRM PRESENTATION No date: egg  retrieval No date: WISDOM TOOTH EXTRACTION  BMI    Body Mass Index: 45.25 kg/m      Reproductive/Obstetrics (+) Pregnancy                             Anesthesia Physical Anesthesia Plan  ASA: 3  Anesthesia Plan: Epidural   Post-op Pain Management:    Induction:   PONV Risk Score and Plan:   Airway Management Planned: Natural Airway  Additional Equipment:   Intra-op Plan:    Post-operative Plan:   Informed Consent: I have reviewed the patients History and Physical, chart, labs and discussed the procedure including the risks, benefits and alternatives for the proposed anesthesia with the patient or authorized representative who has indicated his/her understanding and acceptance.     Dental Advisory Given  Plan Discussed with: Anesthesiologist  Anesthesia Plan Comments: (Patient reports no bleeding problems and no anticoagulant use.   Patient consented for risks of anesthesia including but not limited to:  - adverse reactions to medications - risk of bleeding, infection and or nerve damage from epidural that could lead to paralysis - risk of headache or failed epidural - nerve damage due to positioning - that if epidural is used for C-section that there is a chance of epidural failure requiring spinal placement or conversion to GA - Damage to heart, brain, lungs, other parts of body or loss of life  Patient voiced understanding.)        Anesthesia Quick Evaluation

## 2021-02-01 NOTE — Anesthesia Procedure Notes (Signed)
Epidural Patient location during procedure: OB Start time: 02/01/2021 7:00 PM End time: 02/01/2021 7:15 PM  Staffing Anesthesiologist: Kolten Ryback, Cleda Mccreedy, MD Performed: anesthesiologist   Preanesthetic Checklist Completed: patient identified, IV checked, site marked, risks and benefits discussed, surgical consent, monitors and equipment checked, pre-op evaluation and timeout performed  Epidural Patient position: sitting Prep: ChloraPrep Patient monitoring: heart rate, continuous pulse ox and blood pressure Approach: midline Location: L3-L4 Injection technique: LOR saline  Needle:  Needle type: Tuohy  Needle gauge: 17 G Needle length: 9 cm and 9 Needle insertion depth: 9 cm Catheter type: closed end flexible Catheter size: 19 Gauge Catheter at skin depth: 15 cm Test dose: negative and 1.5% lidocaine with Epi 1:200 K  Assessment Sensory level: T10 Events: blood not aspirated, injection not painful, no injection resistance, no paresthesia and negative IV test  Additional Notes 2 attempts.  First attempt had crisp LOR at 9 cm but unable to thread catheter.  Pt. Evaluated and documentation done after procedure finished. Patient identified. Risks/Benefits/Options discussed with patient including but not limited to bleeding, infection, nerve damage, paralysis, failed block, incomplete pain control, headache, blood pressure changes, nausea, vomiting, reactions to medication both or allergic, itching and postpartum back pain. Confirmed with bedside nurse the patient's most recent platelet count. Confirmed with patient that they are not currently taking any anticoagulation, have any bleeding history or any family history of bleeding disorders. Patient expressed understanding and wished to proceed. All questions were answered. Sterile technique was used throughout the entire procedure. Please see nursing notes for vital signs. Test dose was given through epidural catheter and negative  prior to continuing to dose epidural or start infusion. Warning signs of high block given to the patient including shortness of breath, tingling/numbness in hands, complete motor block, or any concerning symptoms with instructions to call for help. Patient was given instructions on fall risk and not to get out of bed. All questions and concerns addressed with instructions to call with any issues or inadequate analgesia.    Patient tolerated the insertion well without immediate complications.Reason for block:procedure for pain

## 2021-02-01 NOTE — Progress Notes (Addendum)
Intrapartum Progress Note  S: Patient with no complaints. Noting feeling some of the contractions, pain 5/10.   O: Blood pressure 135/90, pulse (!) 104, temperature (!) 97.5 F (36.4 C), temperature source Axillary, resp. rate 18, height 5\' 8"  (1.727 m), weight 135 kg. Gen App: NAD, comfortable Abdomen: soft, gravid FHT: baseline 130 bpm.  Accels present.  Decels absent. moderate in degree variability.   Tocometer: contractions q 2-4 minutes Cervix: funneling present, external os 6, internal os 4-5 cm/60-70%/-3. Extremities: Nontender, no edema.  Pitocin: 24 mIU  Labs: No new labs   Assessment:  1: SIUP at [redacted]w[redacted]d 2. Obesity in pregnancy (BMI 45) 3. IVF pregnancy 4. H/o C/S x 1 (breech presentation) 5. Mildly elevated BP, possible GHTN at term  Plan:  1. Continue IOL with Pitocin. AROM'd with large gush of fluid.  2. Category I tracing.  3. Continue monitoring BPs, intermittent mild elevations.  Patient currently not in significant pain. None at treatable range currently.    [redacted]w[redacted]d, MD 02/01/2021 5:42 PM

## 2021-02-01 NOTE — Progress Notes (Signed)
Anesthesia aware pt is a TOLAC

## 2021-02-02 DIAGNOSIS — D649 Anemia, unspecified: Secondary | ICD-10-CM

## 2021-02-02 DIAGNOSIS — B951 Streptococcus, group B, as the cause of diseases classified elsewhere: Secondary | ICD-10-CM

## 2021-02-02 DIAGNOSIS — O9882 Other maternal infectious and parasitic diseases complicating childbirth: Secondary | ICD-10-CM

## 2021-02-02 DIAGNOSIS — O9902 Anemia complicating childbirth: Secondary | ICD-10-CM

## 2021-02-02 DIAGNOSIS — Z3A39 39 weeks gestation of pregnancy: Secondary | ICD-10-CM

## 2021-02-02 DIAGNOSIS — O09813 Supervision of pregnancy resulting from assisted reproductive technology, third trimester: Secondary | ICD-10-CM

## 2021-02-02 DIAGNOSIS — O34219 Maternal care for unspecified type scar from previous cesarean delivery: Secondary | ICD-10-CM

## 2021-02-02 DIAGNOSIS — O99214 Obesity complicating childbirth: Principal | ICD-10-CM

## 2021-02-02 LAB — RPR: RPR Ser Ql: NONREACTIVE

## 2021-02-02 MED ORDER — PRENATAL MULTIVITAMIN CH
1.0000 | ORAL_TABLET | Freq: Every day | ORAL | Status: DC
  Administered 2021-02-02 – 2021-02-03 (×2): 1 via ORAL
  Filled 2021-02-02 (×2): qty 1

## 2021-02-02 MED ORDER — TRANEXAMIC ACID-NACL 1000-0.7 MG/100ML-% IV SOLN
INTRAVENOUS | Status: AC
  Filled 2021-02-02: qty 100

## 2021-02-02 MED ORDER — DIPHENHYDRAMINE HCL 25 MG PO CAPS: 25.0000 mg | ORAL_CAPSULE | Freq: Four times a day (QID) | ORAL | Status: DC | PRN

## 2021-02-02 MED ORDER — ACETAMINOPHEN 325 MG PO TABS
650.0000 mg | ORAL_TABLET | ORAL | Status: DC | PRN
  Administered 2021-02-02 – 2021-02-03 (×2): 650 mg via ORAL
  Filled 2021-02-02 (×2): qty 2

## 2021-02-02 MED ORDER — ONDANSETRON HCL 4 MG PO TABS: 4.0000 mg | ORAL_TABLET | ORAL | Status: DC | PRN

## 2021-02-02 MED ORDER — BENZOCAINE-MENTHOL 20-0.5 % EX AERO: 1.0000 "application " | INHALATION_SPRAY | CUTANEOUS | Status: DC | PRN

## 2021-02-02 MED ORDER — ONDANSETRON HCL 4 MG/2ML IJ SOLN: 4.0000 mg | INTRAMUSCULAR | Status: DC | PRN

## 2021-02-02 MED ORDER — METHYLERGONOVINE MALEATE 0.2 MG/ML IJ SOLN
INTRAMUSCULAR | Status: AC
  Filled 2021-02-02: qty 1

## 2021-02-02 MED ORDER — SIMETHICONE 80 MG PO CHEW: 80.0000 mg | CHEWABLE_TABLET | ORAL | Status: DC | PRN

## 2021-02-02 MED ORDER — IBUPROFEN 600 MG PO TABS
600.0000 mg | ORAL_TABLET | Freq: Four times a day (QID) | ORAL | Status: DC
  Administered 2021-02-02 – 2021-02-03 (×5): 600 mg via ORAL
  Filled 2021-02-02 (×5): qty 1

## 2021-02-02 MED ORDER — DIBUCAINE (PERIANAL) 1 % EX OINT: 1.0000 "application " | TOPICAL_OINTMENT | CUTANEOUS | Status: DC | PRN

## 2021-02-02 MED ORDER — ZOLPIDEM TARTRATE 5 MG PO TABS: 5.0000 mg | ORAL_TABLET | Freq: Every evening | ORAL | Status: DC | PRN

## 2021-02-02 MED ORDER — COCONUT OIL OIL: 1.0000 "application " | TOPICAL_OIL | Status: DC | PRN

## 2021-02-02 MED ORDER — SENNOSIDES-DOCUSATE SODIUM 8.6-50 MG PO TABS
2.0000 | ORAL_TABLET | Freq: Every day | ORAL | Status: DC
  Administered 2021-02-03: 2 via ORAL
  Filled 2021-02-02: qty 2

## 2021-02-02 MED ORDER — MISOPROSTOL 200 MCG PO TABS
800.0000 ug | ORAL_TABLET | Freq: Once | ORAL | Status: AC
  Administered 2021-02-02: 800 ug via RECTAL

## 2021-02-02 MED ORDER — WITCH HAZEL-GLYCERIN EX PADS: 1.0000 "application " | MEDICATED_PAD | CUTANEOUS | Status: DC | PRN

## 2021-02-02 MED ORDER — CARBOPROST TROMETHAMINE 250 MCG/ML IM SOLN
INTRAMUSCULAR | Status: AC
  Filled 2021-02-02: qty 1

## 2021-02-02 NOTE — Progress Notes (Signed)
Intrapartum Progress Note  S: Patient stating she is feeling some mild pressure.   O: Blood pressure 117/64, pulse (!) 109, temperature 97.8 F (36.6 C), temperature source Oral, resp. rate 16, height 5\' 8"  (1.727 m), weight 135 kg, SpO2 100 %. Gen App: NAD, comfortable Abdomen: soft, gravid FHT: baseline 140 bpm.  Accels present.  Decels present - rare variable deceleration . Moderate variability.   Tocometer: contractions q 2-3 minutes,  Cervix: 10/100/0. Bloody show noted. Arom'd at last check, clear fluid.  Extremities: Nontender, no edema.  Pitocin: 34 mIU  Labs: No new labs   Assessment:  1: SIUP at [redacted]w[redacted]d 2. Obesity in pregnancy (BMI 45) 3. IVF pregnancy 4. H/o C/S x 1 (breech presentation) 5. Occasional mildly elevated BP in earlier parts of labor, possible GHTN at term  Plan:  Continue Pitocin for augmentation. Anticipate delivery soon.  Overall Category I tracing.  BPs remain wnl at this time   [redacted]w[redacted]d, MD 02/02/2021 7:20 AM

## 2021-02-03 ENCOUNTER — Inpatient Hospital Stay: Admit: 2021-02-03 | Payer: Self-pay

## 2021-02-03 ENCOUNTER — Encounter: Payer: Self-pay | Admitting: Obstetrics and Gynecology

## 2021-02-03 DIAGNOSIS — Z8659 Personal history of other mental and behavioral disorders: Secondary | ICD-10-CM

## 2021-02-03 DIAGNOSIS — O99013 Anemia complicating pregnancy, third trimester: Secondary | ICD-10-CM

## 2021-02-03 LAB — CBC
HCT: 28.9 % — ABNORMAL LOW (ref 36.0–46.0)
Hemoglobin: 9.4 g/dL — ABNORMAL LOW (ref 12.0–15.0)
MCH: 27.5 pg (ref 26.0–34.0)
MCHC: 32.5 g/dL (ref 30.0–36.0)
MCV: 84.5 fL (ref 80.0–100.0)
Platelets: 227 10*3/uL (ref 150–400)
RBC: 3.42 MIL/uL — ABNORMAL LOW (ref 3.87–5.11)
RDW: 15.1 % (ref 11.5–15.5)
WBC: 13.6 10*3/uL — ABNORMAL HIGH (ref 4.0–10.5)
nRBC: 0 % (ref 0.0–0.2)

## 2021-02-03 MED ORDER — IBUPROFEN 600 MG PO TABS: 600.0000 mg | ORAL_TABLET | Freq: Four times a day (QID) | ORAL | 0 refills | Status: DC

## 2021-02-03 NOTE — Progress Notes (Signed)
Patient discharged home with family.  Discharge instructions, when to follow up, and prescriptions reviewed with patient.  Patient verbalized understanding. Patient will be escorted out by auxiliary.   

## 2021-02-03 NOTE — Progress Notes (Signed)
Post Partum Day # 1, s/p SVD  Subjective: no complaints, up ad lib, voiding, and tolerating PO  Objective: Temp:  [97.7 F (36.5 C)-98.4 F (36.9 C)] 97.8 F (36.6 C) (11/10 0748) Pulse Rate:  [90-110] 98 (11/10 0748) Resp:  [18-20] 18 (11/10 0748) BP: (110-132)/(69-86) 122/86 (11/10 0748) SpO2:  [98 %-100 %] 99 % (11/10 0748)  Physical Exam:  General: alert and no distress  Lungs: clear to auscultation bilaterally Breasts: normal appearance, no masses or tenderness Heart: regular rate and rhythm, S1, S2 normal, no murmur, click, rub or gallop Abdomen: soft, non-tender; bowel sounds normal; no masses,  no organomegaly Pelvis: Lochia: appropriate, Uterine Fundus: firm Extremities: DVT Evaluation: No evidence of DVT seen on physical exam. Negative Homan's sign. No cords or calf tenderness. No significant calf/ankle edema.  Recent Labs    02/01/21 0741 02/03/21 0419  HGB 11.2* 9.4*  HCT 34.3* 28.9*     Edinburgh Postnatal Depression Scale Screening Tool 02/03/2021  I have been able to laugh and see the funny side of things. 0  I have looked forward with enjoyment to things. 0  I have blamed myself unnecessarily when things went wrong. 1  I have been anxious or worried for no good reason. 2  I have felt scared or panicky for no good reason. 0  Things have been getting on top of me. 0  I have been so unhappy that I have had difficulty sleeping. 0  I have felt sad or miserable. 0  I have been so unhappy that I have been crying. 0  The thought of harming myself has occurred to me. 0  Edinburgh Postnatal Depression Scale Total 3      Assessment/Plan: Doing well postpartum Breastfeeding, Lactation consult Contraception undecided (but has h/o infertility, current pregnancy through IVF) Mild anemia of pregnancy, can treat with PO iron.  Rh neg, for Rhogam as indicated.  H/o depression, no meds currently. Screening negative.  Discharge home   LOS: 2 days   Hildred Laser, MD [facility] 02/03/2021 10:50 AM

## 2021-02-03 NOTE — Discharge Instructions (Signed)

## 2021-02-03 NOTE — Discharge Summary (Signed)
Postpartum Discharge Summary      Patient Name: Mary Bradford DOB: 18-Mar-1986 MRN: [id number]  Date of admission: 02/01/2021 Delivery date:02/02/2021  Delivering provider: Rubie Maid  Date of discharge: 02/03/2021  Admitting diagnosis: Term pregnancy [Z34.90] Intrauterine pregnancy: [redacted]w[redacted]d    Secondary diagnosis:  Active Problems:   Morbid obesity ([place]   Newborn product of in vitro fertilization (IVF) pregnancy   Rh negative state in antepartum period   History of GBS (group B streptococcus) UTI, currently pregnant   Desires VBAC (vaginal birth after cesarean) trial   Term pregnancy   History of depression   Anemia of pregnancy in third trimester  Additional problems: None    Discharge diagnosis: Term Pregnancy Delivered                                              Post partum procedures: None Augmentation: AROM, Pitocin, and IP Foley Complications: None  Hospital course: Induction of Labor With Vaginal Delivery   35y.o. yo [id number] admitted to the hospital 02/01/2021 for induction of labor.  Indication for induction:  Morbid obesity in pregnancy .  Patient had an uncomplicated labor course as follows: Membrane Rupture Time/Date: 5:30 PM ,02/01/2021   Delivery Method:Vaginal, Spontaneous  Episiotomy: None  Lacerations:  2nd degree  Details of delivery can be found in separate delivery note.  Patient had a routine postpartum course. Patient is discharged home 02/03/21.  Newborn Data: Birth date:02/02/2021  Birth time:9:16 AM  Gender:Female  Living status:Living  Apgars:8 ,9  Weight:4020 g   Magnesium Sulfate received: No BMZ received: No Rhophylac:N/A (infant also neg Rh) MMR:No T-DaP:Given prenatally Flu: Given prenatally Transfusion:No  Physical exam  Vitals:   02/02/21 1557 02/02/21 1928 02/02/21 2308 02/03/21 0748  BP: 132/82 111/77 117/69 122/86  Pulse: (!) 109 (!) 104 90 98  Resp: 18 20 20 18   Temp: 98.1 F (36.7 C) 97.8 F  (36.6 C) 97.7 F (36.5 C) 97.8 F (36.6 C)  TempSrc: Oral Oral Oral Oral  SpO2: 99% 98% 100% 99%  Weight:      Height:       General: alert, cooperative, and no distress Lochia: appropriate Uterine Fundus: firm Incision: N/A DVT Evaluation: No evidence of DVT seen on physical exam. Negative Homan's sign. No cords or calf tenderness. No significant calf/ankle edema. Labs: Lab Results  Component Value Date   WBC 13.6 (H) 02/03/2021   HGB 9.4 (L) 02/03/2021   HCT 28.9 (L) 02/03/2021   MCV 84.5 02/03/2021   PLT 227 02/03/2021   No flowsheet data found. Edinburgh Score: Edinburgh Postnatal Depression Scale Screening Tool 02/03/2021  I have been able to laugh and see the funny side of things. 0  I have looked forward with enjoyment to things. 0  I have blamed myself unnecessarily when things went wrong. 1  I have been anxious or worried for no good reason. 2  I have felt scared or panicky for no good reason. 0  Things have been getting on top of me. 0  I have been so unhappy that I have had difficulty sleeping. 0  I have felt sad or miserable. 0  I have been so unhappy that I have been crying. 0  The thought of harming myself has occurred to me. 0  Edinburgh Postnatal Depression Scale Total 3  After visit meds:  Allergies as of 02/03/2021       Reactions   Amoxicillin Other (See Comments)   Has patient had a PCN reaction causing immediate rash, facial/tongue/throat swelling, SOB or lightheadedness with hypotension: Unknown Has patient had a PCN reaction causing severe rash involving mucus membranes or skin necrosis: Unknown Has patient had a PCN reaction that required hospitalization: Unknown Has patient had a PCN reaction occurring within the last 10 years: No If all of the above answers are "NO", then may proceed with Cephalosporin use.        Medication List     STOP taking these medications    aspirin EC 81 MG tablet       TAKE these medications     docusate sodium 100 MG capsule Commonly known as: COLACE Take 100 mg by mouth 2 (two) times daily. As needed   ibuprofen 600 MG tablet Commonly known as: ADVIL Take 1 tablet (600 mg total) by mouth every 6 (six) hours.   Prenatal Vitamin 27-0.8 MG Tabs Take by mouth.         Discharge home in stable condition Infant Feeding: Breast Infant Disposition:home with mother Discharge instruction: per After Visit Summary and Postpartum booklet. Activity: Advance as tolerated. Pelvic rest for 6 weeks.  Diet: routine diet Anticipated Birth Control: Unsure Postpartum Appointment:6 weeks postpartum visit Additional Postpartum F/U: Postpartum Depression checkup in 2-3 weeks Future Appointments:No future appointments. Follow up Visit:  Follow-up Information     Rubie Maid, MD Follow up.   Specialties: Obstetrics and Gynecology, Radiology Why: 3 weeks postpartum mood check (video visit) 6 weeks postpartum visit Contact information: [address] [place] [address] [phone]                     02/03/2021 Rubie Maid, MD

## 2021-02-03 NOTE — Anesthesia Postprocedure Evaluation (Signed)
Anesthesia Post Note  Patient: Mary Bradford  Procedure(s) Performed: AN AD HOC LABOR EPIDURAL  Patient location during evaluation: Mother Baby Anesthesia Type: Epidural Level of consciousness: awake and alert Pain management: pain level controlled Vital Signs Assessment: post-procedure vital signs reviewed and stable Respiratory status: spontaneous breathing, nonlabored ventilation and respiratory function stable Cardiovascular status: stable Postop Assessment: no headache, no backache and epidural receding Anesthetic complications: no   No notable events documented.   Last Vitals:  Vitals:   02/02/21 1928 02/02/21 2308  BP: 111/77 117/69  Pulse: (!) 104 90  Resp: 20 20  Temp: 36.6 C 36.5 C  SpO2: 98% 100%    Last Pain:  Vitals:   02/03/21 0602  TempSrc:   PainSc: 3                  Lynden Oxford

## 2021-02-11 ENCOUNTER — Encounter: Payer: Self-pay | Admitting: Obstetrics and Gynecology

## 2021-02-16 ENCOUNTER — Telehealth (INDEPENDENT_AMBULATORY_CARE_PROVIDER_SITE_OTHER): Payer: BC Managed Care – PPO | Admitting: Obstetrics and Gynecology

## 2021-02-16 ENCOUNTER — Encounter: Payer: Self-pay | Admitting: Obstetrics and Gynecology

## 2021-02-16 VITALS — Ht 68.0 in

## 2021-02-16 DIAGNOSIS — Z1332 Encounter for screening for maternal depression: Secondary | ICD-10-CM | POA: Diagnosis not present

## 2021-02-16 DIAGNOSIS — Z8659 Personal history of other mental and behavioral disorders: Secondary | ICD-10-CM

## 2021-02-16 NOTE — Progress Notes (Signed)
Virtual Visit via Video Note  I connected with Mary Bradford on 02/16/21 at  8:45 AM EST by a video enabled telemedicine application and verified that I am speaking with the correct person using two identifiers.  Location: Patient: Home Provider: Office   I discussed the limitations of evaluation and management by telemedicine and the availability of in person appointments. The patient expressed understanding and agreed to proceed.  History of Present Illness: Mary Bradford is a 35 y.o.  G57P2012 female who is 2 weeks postpartum s/p NSVD (IOL for obesity in pregnancy) who presents for 2 week postpartum mood check. She has a remote history of depression. Notes that overall she is doing well.  Breastfeeding is going well. Reports bleeding has slowed down, denies any pain. Patient denies complaints.  Declines contraception (has a h/o infertility requiring IVF).  Review of Systems - A complete review of systems has been performed and is negative      Observations/Objective: Height 5\' 8"  (1.727 m), currently breastfeeding. Gen App: NAD Psych: normal mood and affect, normal speech    Edinburgh Postnatal Depression Scale - 02/16/21 0849       Edinburgh Postnatal Depression Scale:  In the Past 7 Days   I have been able to laugh and see the funny side of things. 0    I have looked forward with enjoyment to things. 0    I have blamed myself unnecessarily when things went wrong. 1    I have been anxious or worried for no good reason. 1    I have felt scared or panicky for no good reason. 0    Things have been getting on top of me. 1    I have been so unhappy that I have had difficulty sleeping. 0    I have felt sad or miserable. 1    I have been so unhappy that I have been crying. 0    The thought of harming myself has occurred to me. 0    Edinburgh Postnatal Depression Scale Total 4              Assessment and Plan: History of depression - depression screen negative.    Postpartum state, s/p vaginal delivery - continue routine care, f/u in 4 weeks for final postpartum visit  Lactating mother - doing well, no issues.   Follow Up Instructions:    I discussed the assessment and treatment plan with the patient. The patient was provided an opportunity to ask questions and all were answered. The patient agreed with the plan and demonstrated an understanding of the instructions.   The patient was advised to call back or seek an in-person evaluation if the symptoms worsen or if the condition fails to improve as anticipated.  I provided 5 minutes of non-face-to-face time during this encounter.   02/18/21, MD [facility]

## 2021-03-16 ENCOUNTER — Other Ambulatory Visit: Payer: Self-pay

## 2021-03-16 ENCOUNTER — Encounter: Payer: Self-pay | Admitting: Obstetrics and Gynecology

## 2021-03-16 ENCOUNTER — Ambulatory Visit (INDEPENDENT_AMBULATORY_CARE_PROVIDER_SITE_OTHER): Payer: BC Managed Care – PPO | Admitting: Obstetrics and Gynecology

## 2021-03-16 DIAGNOSIS — O9081 Anemia of the puerperium: Secondary | ICD-10-CM

## 2021-03-16 DIAGNOSIS — Z8742 Personal history of other diseases of the female genital tract: Secondary | ICD-10-CM

## 2021-03-16 NOTE — Progress Notes (Signed)
° °  OBSTETRICS POSTPARTUM CLINIC PROGRESS NOTE  Subjective:     Mary Bradford is a 35 y.o. G77P2012 female who presents for a postpartum visit. She is 6 week postpartum following a spontaneous vaginal delivery. I have fully reviewed the prenatal and intrapartum course. The delivery was at 39 gestational weeks, IOL for obesity.  Anesthesia: epidural. Postpartum course has been uncomplicated. Baby's course has been uncomplicated. Baby is feeding by both breast and bottle - Enfamil Neuropro . Bleeding: patient has resumed menses, with No LMP recorded. Bowel function is normal. Bladder function is normal. Patient is not sexually active. Contraception method desired is none, has h/o infertility, pregnancy conceived via IVF. Postpartum depression screening: negative.  EDPS score is 5.    The following portions of the patient's history were reviewed and updated as appropriate: allergies, current medications, past family history, past medical history, past social history, past surgical history, and problem list.  Review of Systems Pertinent items noted in HPI and remainder of comprehensive ROS otherwise negative.   Objective:    BP (!) 149/83    Pulse 92    Resp 16    Ht 5\' 8"  (1.727 m)    Wt 279 lb 12.8 oz (126.9 kg)    Breastfeeding Yes    BMI 42.54 kg/m   General:  alert and no distress   Breasts:  inspection negative, no nipple discharge or bleeding, no masses or nodularity palpable  Lungs: clear to auscultation bilaterally  Heart:  regular rate and rhythm, S1, S2 normal, no murmur, click, rub or gallop  Abdomen: soft, non-tender; bowel sounds normal; no masses,  no organomegaly.     Vulva:  normal  Vagina: normal vagina, no discharge, exudate, lesion, or erythema  Cervix:  no cervical motion tenderness and no lesions  Corpus: normal size, contour, position, consistency, mobility, non-tender  Adnexa:  normal adnexa and no mass, fullness, tenderness  Rectal Exam: Not performed.          Labs:  Lab Results  Component Value Date   HGB 9.4 (L) 02/03/2021     Assessment:   1. Postpartum care following vaginal delivery   2. Postpartum anemia   3. H/O infertility      Plan:    1. Contraception: none, patient with h/o infertility. 2. Will check Hgb for h/o postpartum anemia of less than 10.  3. Follow up in:  4-6  months with PCP for routine wellness care, or sooner as needed.    13/12/2020, MD [facility]

## 2021-03-17 LAB — HEMOGLOBIN AND HEMATOCRIT, BLOOD
Hematocrit: 37.4 % (ref 34.0–46.6)
Hemoglobin: 12 g/dL (ref 11.1–15.9)

## 2021-04-07 ENCOUNTER — Telehealth: Payer: Self-pay | Admitting: Obstetrics and Gynecology

## 2021-04-07 NOTE — Telephone Encounter (Signed)
Pt came into office today requesting to have a return to work form filled out. Made clinical staff aware. Notified Dr Valentino Saxon and AP.

## 2022-01-03 ENCOUNTER — Encounter: Payer: Self-pay | Admitting: Nurse Practitioner

## 2022-01-03 ENCOUNTER — Ambulatory Visit: Payer: BC Managed Care – PPO | Admitting: Nurse Practitioner

## 2022-01-03 DIAGNOSIS — M545 Low back pain, unspecified: Secondary | ICD-10-CM | POA: Insufficient documentation

## 2022-01-03 DIAGNOSIS — L658 Other specified nonscarring hair loss: Secondary | ICD-10-CM | POA: Insufficient documentation

## 2022-01-03 DIAGNOSIS — E282 Polycystic ovarian syndrome: Secondary | ICD-10-CM

## 2022-01-03 MED ORDER — METHYLPREDNISOLONE 4 MG PO TBPK: ORAL_TABLET | ORAL | 0 refills | Status: DC

## 2022-01-03 MED ORDER — CYCLOBENZAPRINE HCL 10 MG PO TABS: 10.0000 mg | ORAL_TABLET | Freq: Three times a day (TID) | ORAL | 0 refills | Status: DC | PRN

## 2022-01-03 NOTE — Assessment & Plan Note (Signed)
Check labs today, is having some hair loss and fatigue.

## 2022-01-03 NOTE — Assessment & Plan Note (Signed)
BMI 40.63.  Recommended eating smaller high protein, low fat meals more frequently and exercising 30 mins a day 5 times a week with a goal of 10-15lb weight loss in the next 3 months. Patient voiced their understanding and motivation to adhere to these recommendations.

## 2022-01-03 NOTE — Patient Instructions (Signed)
Acute Back Pain, Adult Acute back pain is sudden and usually short-lived. It is often caused by an injury to the muscles and tissues in the back. The injury may result from: A muscle, tendon, or ligament getting overstretched or torn. Ligaments are tissues that connect bones to each other. Lifting something improperly can cause a back strain. Wear and tear (degeneration) of the spinal disks. Spinal disks are circular tissue that provide cushioning between the bones of the spine (vertebrae). Twisting motions, such as while playing sports or doing yard work. A hit to the back. Arthritis. You may have a physical exam, lab tests, and imaging tests to find the cause of your pain. Acute back pain usually goes away with rest and home care. Follow these instructions at home: Managing pain, stiffness, and swelling Take over-the-counter and prescription medicines only as told by your health care provider. Treatment may include medicines for pain and inflammation that are taken by mouth or applied to the skin, or muscle relaxants. Your health care provider may recommend applying ice during the first 24-48 hours after your pain starts. To do this: Put ice in a plastic bag. Place a towel between your skin and the bag. Leave the ice on for 20 minutes, 2-3 times a day. Remove the ice if your skin turns bright red. This is very important. If you cannot feel pain, heat, or cold, you have a greater risk of damage to the area. If directed, apply heat to the affected area as often as told by your health care provider. Use the heat source that your health care provider recommends, such as a moist heat pack or a heating pad. Place a towel between your skin and the heat source. Leave the heat on for 20-30 minutes. Remove the heat if your skin turns bright red. This is especially important if you are unable to feel pain, heat, or cold. You have a greater risk of getting burned. Activity  Do not stay in bed. Staying in  bed for more than 1-2 days can delay your recovery. Sit up and stand up straight. Avoid leaning forward when you sit or hunching over when you stand. If you work at a desk, sit close to it so you do not need to lean over. Keep your chin tucked in. Keep your neck drawn back, and keep your elbows bent at a 90-degree angle (right angle). Sit high and close to the steering wheel when you drive. Add lower back (lumbar) support to your car seat, if needed. Take short walks on even surfaces as soon as you are able. Try to increase the length of time you walk each day. Do not sit, drive, or stand in one place for more than 30 minutes at a time. Sitting or standing for long periods of time can put stress on your back. Do not drive or use heavy machinery while taking prescription pain medicine. Use proper lifting techniques. When you bend and lift, use positions that put less stress on your back: Bend your knees. Keep the load close to your body. Avoid twisting. Exercise regularly as told by your health care provider. Exercising helps your back heal faster and helps prevent back injuries by keeping muscles strong and flexible. Work with a physical therapist to make a safe exercise program, as recommended by your health care provider. Do any exercises as told by your physical therapist. Lifestyle Maintain a healthy weight. Extra weight puts stress on your back and makes it difficult to have good   posture. Avoid activities or situations that make you feel anxious or stressed. Stress and anxiety increase muscle tension and can make back pain worse. Learn ways to manage anxiety and stress, such as through exercise. General instructions Sleep on a firm mattress in a comfortable position. Try lying on your side with your knees slightly bent. If you lie on your back, put a pillow under your knees. Keep your head and neck in a straight line with your spine (neutral position) when using electronic equipment like  smartphones or pads. To do this: Raise your smartphone or pad to look at it instead of bending your head or neck to look down. Put the smartphone or pad at the level of your face while looking at the screen. Follow your treatment plan as told by your health care provider. This may include: Cognitive or behavioral therapy. Acupuncture or massage therapy. Meditation or yoga. Contact a health care provider if: You have pain that is not relieved with rest or medicine. You have increasing pain going down into your legs or buttocks. Your pain does not improve after 2 weeks. You have pain at night. You lose weight without trying. You have a fever or chills. You develop nausea or vomiting. You develop abdominal pain. Get help right away if: You develop new bowel or bladder control problems. You have unusual weakness or numbness in your arms or legs. You feel faint. These symptoms may represent a serious problem that is an emergency. Do not wait to see if the symptoms will go away. Get medical help right away. Call your local emergency services (911 in the [place] Do not drive yourself to the hospital. Summary Acute back pain is sudden and usually short-lived. Use proper lifting techniques. When you bend and lift, use positions that put less stress on your back. Take over-the-counter and prescription medicines only as told by your health care provider, and apply heat or ice as told. This information is not intended to replace advice given to you by your health care provider. Make sure you discuss any questions you have with your health care provider. Document Revised: 06/04/2020 Document Reviewed: 06/04/2020 Elsevier Patient Education  2023 [employer]  

## 2022-01-03 NOTE — Assessment & Plan Note (Signed)
Acute for 4 days.  At this time will send in steroid taper and Flexeril to use at night for rest/pain.  Overall she reports is beginning to improve.  No red flags.  Recommend continue Aleeve and heat at home.  Return to office if worsening or ongoing.

## 2022-01-03 NOTE — Progress Notes (Signed)
Acute Office Visit  Subjective:     Patient ID: Mary Bradford, female    DOB: Sep 28, 1985, 36 y.o.   MRN: [id number]  Chief Complaint  Patient presents with   Back Pain    Patient is here for Back Pain. Patient says her back has been bothering her since she woke up Saturday. Patient says she is not aware of what may have happened. 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 Patient says when she turns certain ways and stand up. Patient says she has been taking Aleve and it seems to be helping a little bit.     Started with back pain on Saturday, no recent injuries she is aware of.  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  Pain is present to lower back, midline.  In 2017 she slipped on ice in driveway, had issues with back then -- disc protrusion at time.  Last night she noticed pain radiating to left hip.  Overall she reports feels it is improving.  Back Pain This is a new problem. The current episode started in the past 7 days. The problem occurs intermittently. The problem is unchanged. The pain is present in the lumbar spine and sacro-iliac (left hip area). The quality of the pain is described as aching and shooting. Radiates to: left hip radiation. The pain is at a severity of 4/10 (with Aleeve on board). The pain is mild. The pain is The same all the time. The symptoms are aggravated by bending, position and twisting. Stiffness is present In the morning. Pertinent negatives include no bladder incontinence, chest pain, fever, leg pain, numbness or weight loss. Risk factors include sedentary lifestyle. She has tried bed rest, NSAIDs and heat for the symptoms. The treatment provided moderate relief.   HAIR LOSS Had issues during pregnancy but no treatment.  Has PCOS. Fatigue: yes Cold intolerance: no Heat intolerance: no Weight gain: no Weight loss: no Constipation:  yes Diarrhea/loose stools: yes Palpitations: no Lower extremity edema: no Anxiety/depressed mood: no   Patient is in today for back pain and hair loss concerns  Review of Systems  Constitutional:  Positive for malaise/fatigue. Negative for chills, fever and weight loss.  Respiratory: Negative.    Cardiovascular:  Negative for chest pain, palpitations, leg swelling and PND.  Gastrointestinal: Negative.   Genitourinary: Negative.  Negative for bladder incontinence.  Musculoskeletal:  Positive for back pain.  Skin:        Hair loss  Neurological: Negative.  Negative for numbness.  Psychiatric/Behavioral: Negative.        Objective:    BP 122/83   Pulse 88   Temp 97.8 F (36.6 C) (Oral)   Ht 5\' 8"  (1.727 m)   Wt 267 lb 3.2 oz (121.2 kg)   SpO2 99%   BMI 40.63 kg/m  BP Readings from Last 3 Encounters:  01/03/22 122/83  03/16/21 (!) 149/83  02/03/21 122/86   Wt Readings from Last 3 Encounters:  01/03/22 267 lb 3.2 oz (121.2 kg)  03/16/21 279 lb 12.8 oz (126.9 kg)  02/01/21 297 lb 9.9 oz (135 kg)   Physical Exam Vitals and nursing note reviewed.  Constitutional:      General: She is awake. She is not in acute distress.    Appearance: She is well-developed and well-groomed. She is obese. She is not ill-appearing or toxic-appearing.  HENT:     Head: Normocephalic.  Hair is abnormal (mild thinning anteriorly).     Right Ear: Hearing normal.     Left Ear: Hearing normal.  Eyes:     General: Lids are normal.        Right eye: No discharge.        Left eye: No discharge.     Conjunctiva/sclera: Conjunctivae normal.     Pupils: Pupils are equal, round, and reactive to light.  Neck:     Thyroid: No thyromegaly.     Vascular: No carotid bruit.  Cardiovascular:     Rate and Rhythm: Normal rate and regular rhythm.     Heart sounds: Normal heart sounds. No murmur heard.    No gallop.  Pulmonary:     Effort: Pulmonary effort is normal. No accessory muscle usage or  respiratory distress.     Breath sounds: Normal breath sounds.  Abdominal:     General: Bowel sounds are normal.     Palpations: Abdomen is soft. There is no hepatomegaly or splenomegaly.  Musculoskeletal:     Cervical back: Normal range of motion and neck supple.     Lumbar back: Tenderness (at left SI) present. No swelling or spasms. Normal range of motion. Negative right straight leg raise test and negative left straight leg raise test.     Right lower leg: No edema.     Left lower leg: No edema.     Comments: No rashes  Lymphadenopathy:     Cervical: No cervical adenopathy.  Skin:    General: Skin is warm and dry.  Neurological:     Mental Status: She is alert and oriented to person, place, and time.  Psychiatric:        Attention and Perception: Attention normal.        Mood and Affect: Mood normal.        Speech: Speech normal.        Behavior: Behavior normal. Behavior is cooperative.        Thought Content: Thought content normal.     No results found for any visits on 01/03/22.      Assessment & Plan:   Problem List Items Addressed This Visit       Endocrine   PCOS (polycystic ovarian syndrome)    Check labs today, is having some hair loss and fatigue.        Relevant Orders   TSH   Comprehensive metabolic panel   CBC with Differential/Platelet   HgB A1c     Musculoskeletal and Integument   Female pattern hair loss    Over past months - ?related to PCOS vs thyroid.  Check labs today.  Could consider starting low dose Spironolactone for hair loss with PCOS if labs all stable, discussed with patient.  If start this then would see patient back in 3 months.      Relevant Orders   T4, free   Thyroid peroxidase antibody   TSH   Comprehensive metabolic panel   CBC with Differential/Platelet     Other   Acute low back pain    Acute for 4 days.  At this time will send in steroid taper and Flexeril to use at night for rest/pain.  Overall she reports is  beginning to improve.  No red flags.  Recommend continue Aleeve and heat at home.  Return to office if worsening or ongoing.      Relevant Medications   methylPREDNISolone (MEDROL DOSEPAK) 4 MG TBPK tablet   cyclobenzaprine (FLEXERIL)  10 MG tablet   Morbid obesity ([place]) - Primary    BMI 40.63.  Recommended eating smaller high protein, low fat meals more frequently and exercising 30 mins a day 5 times a week with a goal of 10-15lb weight loss in the next 3 months. Patient voiced their understanding and motivation to adhere to these recommendations.        Meds ordered this encounter  Medications   methylPREDNISolone (MEDROL DOSEPAK) 4 MG TBPK tablet    Sig: Take as directed on package.    Dispense:  21 tablet    Refill:  0   cyclobenzaprine (FLEXERIL) 10 MG tablet    Sig: Take 1 tablet (10 mg total) by mouth 3 (three) times daily as needed for muscle spasms.    Dispense:  30 tablet    Refill:  0    Return if symptoms worsen or fail to improve.  Venita Lick, NP

## 2022-01-03 NOTE — Assessment & Plan Note (Signed)
Over past months - ?related to PCOS vs thyroid.  Check labs today.  Could consider starting low dose Spironolactone for hair loss with PCOS if labs all stable, discussed with patient.  If start this then would see patient back in 3 months.

## 2022-01-04 ENCOUNTER — Encounter: Payer: Self-pay | Admitting: Nurse Practitioner

## 2022-01-04 LAB — COMPREHENSIVE METABOLIC PANEL
ALT: 17 IU/L (ref 0–32)
AST: 15 IU/L (ref 0–40)
Albumin/Globulin Ratio: 1.8 (ref 1.2–2.2)
Albumin: 4.5 g/dL (ref 3.9–4.9)
Alkaline Phosphatase: 121 IU/L (ref 44–121)
BUN/Creatinine Ratio: 13 (ref 9–23)
BUN: 9 mg/dL (ref 6–20)
Bilirubin Total: 0.2 mg/dL (ref 0.0–1.2)
CO2: 23 mmol/L (ref 20–29)
Calcium: 9.2 mg/dL (ref 8.7–10.2)
Chloride: 104 mmol/L (ref 96–106)
Creatinine, Ser: 0.67 mg/dL (ref 0.57–1.00)
Globulin, Total: 2.5 g/dL (ref 1.5–4.5)
Glucose: 80 mg/dL (ref 70–99)
Potassium: 4.3 mmol/L (ref 3.5–5.2)
Sodium: 142 mmol/L (ref 134–144)
Total Protein: 7 g/dL (ref 6.0–8.5)
eGFR: 116 mL/min/{1.73_m2} (ref >59)

## 2022-01-04 LAB — CBC WITH DIFFERENTIAL/PLATELET
Basophils Absolute: 0 10*3/uL (ref 0.0–0.2)
Basos: 0 %
EOS (ABSOLUTE): 0.2 10*3/uL (ref 0.0–0.4)
Eos: 2 %
Hematocrit: 38.1 % (ref 34.0–46.6)
Hemoglobin: 12.5 g/dL (ref 11.1–15.9)
Immature Grans (Abs): 0 10*3/uL (ref 0.0–0.1)
Immature Granulocytes: 0 %
Lymphocytes Absolute: 3.1 10*3/uL (ref 0.7–3.1)
Lymphs: 33 %
MCH: 26.4 pg — ABNORMAL LOW (ref 26.6–33.0)
MCHC: 32.8 g/dL (ref 31.5–35.7)
MCV: 81 fL (ref 79–97)
Monocytes Absolute: 0.7 10*3/uL (ref 0.1–0.9)
Monocytes: 7 %
Neutrophils Absolute: 5.6 10*3/uL (ref 1.4–7.0)
Neutrophils: 58 %
Platelets: 369 10*3/uL (ref 150–450)
RBC: 4.73 x10E6/uL (ref 3.77–5.28)
RDW: 13.6 % (ref 11.7–15.4)
WBC: 9.6 10*3/uL (ref 3.4–10.8)

## 2022-01-04 LAB — TSH: TSH: 0.589 u[IU]/mL (ref 0.450–4.500)

## 2022-01-04 LAB — HEMOGLOBIN A1C
Est. average glucose Bld gHb Est-mCnc: 105 mg/dL
Hgb A1c MFr Bld: 5.3 % (ref 4.8–5.6)

## 2022-01-04 LAB — T4, FREE: Free T4: 1.2 ng/dL (ref 0.82–1.77)

## 2022-01-04 LAB — THYROID PEROXIDASE ANTIBODY: Thyroperoxidase Ab SerPl-aCnc: <9 IU/mL (ref 0–34)

## 2022-01-04 NOTE — Progress Notes (Signed)
Contacted via [place] morning Mary Bradford, your labs have returned and overall are stable with no concerns noted.  I do suspect hair thinning may be from your PCOS.  Would you like to try the Spironolactone I mentioned?  Let me know, as long as not breast feeding or pregnant this is okay to use.  If you would like to try then alert me and I will send in. Keep being awesome!!  Thank you for allowing me to participate in your care.  I appreciate you. Kindest regards, Klaudia Beirne

## 2022-01-05 ENCOUNTER — Encounter: Payer: Self-pay | Admitting: Obstetrics and Gynecology

## 2022-01-05 MED ORDER — SPIRONOLACTONE 50 MG PO TABS: 50.0000 mg | ORAL_TABLET | Freq: Every day | ORAL | 0 refills | Status: DC

## 2022-03-27 NOTE — Patient Instructions (Signed)
Spironolactone; Hydrochlorothiazide Tablets What is this medication? SPIRONOLACTONE; HYDROCHLOROTHIAZIDE (speer on oh LAK tone; hye droe klor oh THYE a zide) treats high blood pressure. It may also be used to reduce swelling related to heart, kidney, or liver disease. It helps your kidneys remove more fluid and salt from your blood through the urine. It is a combination of two diuretics. This medicine may be used for other purposes; ask your health care provider or pharmacist if you have questions. COMMON BRAND NAME(S): Aldactazide What should I tell my care team before I take this medication? They need to know if you have any of these conditions: Diabetes Immune system problems, like lupus Kidney disease Liver disease An unusual reaction to spironolactone, hydrochlorothiazide, sulfa medications, other medications, foods, dyes, or preservatives Pregnant or trying to get pregnant Breast-feeding How should I use this medication? Take this medication by mouth. Take it as directed on the prescription label at the same time every day. You can take it with or without food. If it upsets your stomach, take it with food. Keep taking it unless your care team tells you to stop. Talk to your care team about the use of this medication in children. Special care may be needed. Overdosage: If you think you have taken too much of this medicine contact a poison control center or emergency room at once. NOTE: This medicine is only for you. Do not share this medicine with others. What if I miss a dose? If you miss a dose, take it as soon as you can. If it is almost time for your next dose, take only that dose. Do not take double or extra doses. What may interact with this medication? Do not take this medication with any of the following: Cidofovir Eplerenone Tranylcypromine This medication may also interact with the following: Aspirin Certain medications for blood pressure, heart disease like benazepril,  lisinopril, losartan, valsartan Certain medications that treat or prevent blood clots like heparin and enoxaparin Cholestyramine Cyclosporine Digoxin Lithium Medications that relax muscles for surgery NSAIDs, medications for pain and inflammation, like ibuprofen or naproxen Other diuretics Potassium supplements Steroid medications like prednisone or cortisone Trimethoprim This list may not describe all possible interactions. Give your health care provider a list of all the medicines, herbs, non-prescription drugs, or dietary supplements you use. Also tell them if you smoke, drink alcohol, or use illegal drugs. Some items may interact with your medicine. What should I watch for while using this medication? Visit your care team for regular checks on your progress. Check your blood pressure as directed. Ask your care team what your blood pressure should be, and when you should contact them. This medication may increase blood sugar. Ask your care team if changes in diet or medications are needed if you have diabetes. You must not get dehydrated. Ask your care team how much fluid you need to drink a day. Check with them if you get an attack of severe diarrhea, nausea and vomiting, or if you sweat a lot. The loss of too much body fluid can make it dangerous for you to take this medication. You may get drowsy or dizzy. Do not drive, use machinery, or do anything that needs mental alertness until you know how this medication affects you. Do not stand or sit up quickly, especially if you are an older patient. This reduces the risk of dizzy or fainting spells. Alcohol may interfere with the effect of this medication. Avoid alcoholic drinks. Talk to your care team about your  risk of skin cancer. You may be more at risk for skin cancer if you take this medication. This medication can make you more sensitive to the sun. Keep out of the sun. If you cannot avoid being in the sun, wear protective clothing and use  sunscreen. Do not use sun lamps or tanning beds/booths. What side effects may I notice from receiving this medication? Side effects that you should report to your care team as soon as possible: Allergic reactions--skin rash, itching, hives, swelling of the face, lips, tongue, or throat Dehydration--increased thirst, dry mouth, feeling faint or lightheaded, headache, dark yellow or brown urine Gout--severe pain, redness, warmth, or swelling in joints, such as the big toe High potassium level--muscle weakness, fast or irregular heartbeat Kidney injury--decrease in the amount of urine, swelling of the ankles, hands, or feet Low blood pressure--dizziness, feeling faint or lightheaded, blurry vision Low potassium level--muscle pain or cramps, unusual weakness or fatigue, fast or irregular heartbeat, constipation Sudden eye pain or change in vision such as blurry vision, seeing halos around lights, vision loss Side effects that usually do not require medical attention (report to your care team if they continue or are bothersome): Breast pain or tenderness Change in sex drive or performance Drowsiness Headache Irregular menstrual cycles or spotting Unexpected breast tissue growth Upset stomach This list may not describe all possible side effects. Call your doctor for medical advice about side effects. You may report side effects to FDA at 1-800-FDA-1088. Where should I keep my medication? Keep out of the reach of children and pets. Store at room temperature between 20 and 25 degrees C (68 and 77 degrees F). Throw away any unused medication after the expiration date. NOTE: This sheet is a summary. It may not cover all possible information. If you have questions about this medicine, talk to your doctor, pharmacist, or health care provider.  2023 Elsevier/Gold Standard (2007-05-04 00:00:00)

## 2022-03-31 ENCOUNTER — Ambulatory Visit: Payer: BC Managed Care – PPO | Admitting: Nurse Practitioner

## 2022-03-31 ENCOUNTER — Encounter: Payer: Self-pay | Admitting: Nurse Practitioner

## 2022-03-31 DIAGNOSIS — Z6841 Body Mass Index (BMI) 40.0 and over, adult: Secondary | ICD-10-CM

## 2022-03-31 DIAGNOSIS — L658 Other specified nonscarring hair loss: Secondary | ICD-10-CM | POA: Diagnosis not present

## 2022-03-31 DIAGNOSIS — Z23 Encounter for immunization: Secondary | ICD-10-CM

## 2022-03-31 DIAGNOSIS — E282 Polycystic ovarian syndrome: Secondary | ICD-10-CM | POA: Diagnosis not present

## 2022-03-31 MED ORDER — SPIRONOLACTONE 50 MG PO TABS
50.0000 mg | ORAL_TABLET | Freq: Every day | ORAL | 4 refills | Status: DC
Start: 2022-03-31 — End: 2023-01-01

## 2022-03-31 NOTE — Assessment & Plan Note (Signed)
BMI 40.20.  Recommended eating smaller high protein, low fat meals more frequently and exercising 30 mins a day 5 times a week with a goal of 10-15lb weight loss in the next 3 months. Patient voiced their understanding and motivation to adhere to these recommendations. ? ?

## 2022-03-31 NOTE — Assessment & Plan Note (Signed)
Chronic, stable with addition of Spironolactone for hair loss.  Continue this and monitor labs.  BMP today.

## 2022-03-31 NOTE — Assessment & Plan Note (Signed)
Suspect related to PCOS, improving with Spironolactone on board.  Check labs today to assess K+ level.

## 2022-03-31 NOTE — Progress Notes (Signed)
BP 122/88   Pulse 98   Temp 98.3 F (36.8 C) (Oral)   Ht 5' 7.99" (1.727 m)   Wt 264 lb 4.8 oz (119.9 kg)   SpO2 98%   BMI 40.20 kg/m    Subjective:    Patient ID: Mary Bradford, female    DOB: 1985/12/11, 37 y.o.   MRN: [id number]  HPI: Mary Bradford is a 37 y.o. female  Chief Complaint  Patient presents with   Alopecia    Here for follow up since starting spironolactone   HAIR LOSS Started on Spironolactone last visit 01/03/22.  Has PCOS.  She feels like this is helping.  Currently lost dog over Christmas, some depression and anxiety over this. Fatigue: yes Cold intolerance: no Heat intolerance: no Weight gain: no Weight loss: no Constipation: yes Diarrhea/loose stools: yes Palpitations: no Lower extremity edema: no Anxiety/depressed mood: no      03/31/2022    4:21 PM 01/03/2022    2:27 PM 08/26/2020    4:40 PM 07/03/2019    9:58 AM 06/04/2019   11:11 AM  Depression screen PHQ 2/9  Decreased Interest 1 0 0 0 2  Down, Depressed, Hopeless 1 1 0 2 2  PHQ - 2 Score 2 1 0 2 4  Altered sleeping 0 3 1 1 3   Tired, decreased energy 1 3 1 1 3   Change in appetite 0 0 0 1 1  Feeling bad or failure about yourself  0 1 0 0 0  Trouble concentrating 0 1 0 1 1  Moving slowly or fidgety/restless 0 0 0 0 0  Suicidal thoughts 1 0 0 0 0  PHQ-9 Score 4 9 2 6 12   Difficult doing work/chores Not difficult at all Somewhat difficult Not difficult at all Somewhat difficult Somewhat difficult       03/31/2022    4:21 PM 01/03/2022    2:27 PM 08/26/2020    4:40 PM 07/03/2019    9:59 AM  GAD 7 : Generalized Anxiety Score  Nervous, Anxious, on Edge 1 2 1  0  Control/stop worrying 0 1 0 1  Worry too much - different things 1 1 1 1   Trouble relaxing 2 1 1 1   Restless 0 1 0 0  Easily annoyed or irritable 1 2 1  0  Afraid - awful might happen 0 0 0 0  Total GAD 7 Score 5 8 4 3   Anxiety Difficulty Not difficult at all Somewhat difficult Somewhat difficult Somewhat difficult    Relevant past medical, surgical, family and social history reviewed and updated as indicated. Interim medical history since our last visit reviewed. Allergies and medications reviewed and updated.  Review of Systems  Constitutional:  Negative for activity change, appetite change, diaphoresis, fatigue and fever.  Respiratory:  Negative for cough, chest tightness and shortness of breath.   Cardiovascular:  Negative for chest pain, palpitations and leg swelling.  Gastrointestinal: Negative.   Neurological: Negative.   Psychiatric/Behavioral:  Negative for decreased concentration, self-injury, sleep disturbance and suicidal ideas. The patient is not nervous/anxious.     Per HPI unless specifically indicated above     Objective:    BP 122/88   Pulse 98   Temp 98.3 F (36.8 C) (Oral)   Ht 5' 7.99" (1.727 m)   Wt 264 lb 4.8 oz (119.9 kg)   SpO2 98%   BMI 40.20 kg/m   Wt Readings from Last 3 Encounters:  03/31/22 264 lb 4.8 oz (119.9 kg)  01/03/22 267  lb 3.2 oz (121.2 kg)  03/16/21 279 lb 12.8 oz (126.9 kg)    Physical Exam Vitals and nursing note reviewed.  Constitutional:      General: She is awake. She is not in acute distress.    Appearance: She is well-developed and well-groomed. She is obese. She is not ill-appearing or toxic-appearing.  HENT:     Head: Normocephalic. Hair is normal (hair pattern thicker and improving this check).     Right Ear: Hearing normal.     Left Ear: Hearing normal.  Eyes:     General: Lids are normal.        Right eye: No discharge.        Left eye: No discharge.     Conjunctiva/sclera: Conjunctivae normal.     Pupils: Pupils are equal, round, and reactive to light.  Neck:     Thyroid: No thyromegaly.     Vascular: No carotid bruit.  Cardiovascular:     Rate and Rhythm: Normal rate and regular rhythm.     Heart sounds: Normal heart sounds. No murmur heard.    No gallop.  Pulmonary:     Effort: Pulmonary effort is normal. No accessory  muscle usage or respiratory distress.     Breath sounds: Normal breath sounds.  Abdominal:     General: Bowel sounds are normal.     Palpations: Abdomen is soft. There is no hepatomegaly or splenomegaly.  Musculoskeletal:     Cervical back: Normal range of motion and neck supple.     Right lower leg: No edema.     Left lower leg: No edema.  Lymphadenopathy:     Cervical: No cervical adenopathy.  Skin:    General: Skin is warm and dry.  Neurological:     Mental Status: She is alert and oriented to person, place, and time.  Psychiatric:        Attention and Perception: Attention normal.        Mood and Affect: Mood normal.        Speech: Speech normal.        Behavior: Behavior normal. Behavior is cooperative.        Thought Content: Thought content normal.     Results for orders placed or performed in visit on 01/03/22  T4, free  Result Value Ref Range   Free T4 1.20 0.82 - 1.77 ng/dL  Thyroid peroxidase antibody  Result Value Ref Range   Thyroperoxidase Ab SerPl-aCnc <9 0 - 34 IU/mL  TSH  Result Value Ref Range   TSH 0.589 0.450 - 4.500 uIU/mL  Comprehensive metabolic panel  Result Value Ref Range   Glucose 80 70 - 99 mg/dL   BUN 9 6 - 20 mg/dL   Creatinine, Ser 0.35 0.57 - 1.00 mg/dL   eGFR 009 >38 HW/EXH/3.71   BUN/Creatinine Ratio 13 9 - 23   Sodium 142 134 - 144 mmol/L   Potassium 4.3 3.5 - 5.2 mmol/L   Chloride 104 96 - 106 mmol/L   CO2 23 20 - 29 mmol/L   Calcium 9.2 8.7 - 10.2 mg/dL   Total Protein 7.0 6.0 - 8.5 g/dL   Albumin 4.5 3.9 - 4.9 g/dL   Globulin, Total 2.5 1.5 - 4.5 g/dL   Albumin/Globulin Ratio 1.8 1.2 - 2.2   Bilirubin Total 0.2 0.0 - 1.2 mg/dL   Alkaline Phosphatase 121 44 - 121 IU/L   AST 15 0 - 40 IU/L   ALT 17 0 - 32 IU/L  CBC with Differential/Platelet  Result Value Ref Range   WBC 9.6 3.4 - 10.8 x10E3/uL   RBC 4.73 3.77 - 5.28 x10E6/uL   Hemoglobin 12.5 11.1 - 15.9 g/dL   Hematocrit 38.1 34.0 - 46.6 %   MCV 81 79 - 97 fL   MCH  26.4 (L) 26.6 - 33.0 pg   MCHC 32.8 31.5 - 35.7 g/dL   RDW 13.6 11.7 - 15.4 %   Platelets 369 150 - 450 x10E3/uL   Neutrophils 58 Not Estab. %   Lymphs 33 Not Estab. %   Monocytes 7 Not Estab. %   Eos 2 Not Estab. %   Basos 0 Not Estab. %   Neutrophils Absolute 5.6 1.4 - 7.0 x10E3/uL   Lymphocytes Absolute 3.1 0.7 - 3.1 x10E3/uL   Monocytes Absolute 0.7 0.1 - 0.9 x10E3/uL   EOS (ABSOLUTE) 0.2 0.0 - 0.4 x10E3/uL   Basophils Absolute 0.0 0.0 - 0.2 x10E3/uL   Immature Granulocytes 0 Not Estab. %   Immature Grans (Abs) 0.0 0.0 - 0.1 x10E3/uL  HgB A1c  Result Value Ref Range   Hgb A1c MFr Bld 5.3 4.8 - 5.6 %   Est. average glucose Bld gHb Est-mCnc 105 mg/dL      Assessment & Plan:   Problem List Items Addressed This Visit       Endocrine   PCOS (polycystic ovarian syndrome)    Chronic, stable with addition of Spironolactone for hair loss.  Continue this and monitor labs.  BMP today.      Relevant Orders   Basic metabolic panel     Musculoskeletal and Integument   Female pattern hair loss    Suspect related to PCOS, improving with Spironolactone on board.  Check labs today to assess K+ level.      Relevant Orders   Basic metabolic panel     Other   BMI 40.0-44.9, adult ([place])    Recommend  focus on healthy diet choices and regular physical activity (30 minutes 5 days a week).  Focus on small goals at a time.       Morbid obesity ([place]) - Primary    BMI 40.20.  Recommended eating smaller high protein, low fat meals more frequently and exercising 30 mins a day 5 times a week with a goal of 10-15lb weight loss in the next 3 months. Patient voiced their understanding and motivation to adhere to these recommendations.       Other Visit Diagnoses     Flu vaccine need       Flu vaccine in office today.   Relevant Orders   Flu Vaccine QUAD 6+ mos PF IM (Fluarix Quad PF) (Completed)        Follow up plan: Return in about 9 months (around 01/08/2023) for Annual  physical.

## 2022-03-31 NOTE — Assessment & Plan Note (Signed)
Recommend  focus on healthy diet choices and regular physical activity (30 minutes 5 days a week).  Focus on small goals at a time.

## 2022-04-01 LAB — BASIC METABOLIC PANEL
BUN/Creatinine Ratio: 10 (ref 9–23)
BUN: 9 mg/dL (ref 6–20)
CO2: 23 mmol/L (ref 20–29)
Calcium: 9 mg/dL (ref 8.7–10.2)
Chloride: 103 mmol/L (ref 96–106)
Creatinine, Ser: 0.88 mg/dL (ref 0.57–1.00)
Glucose: 92 mg/dL (ref 70–99)
Potassium: 4.1 mmol/L (ref 3.5–5.2)
Sodium: 140 mmol/L (ref 134–144)
eGFR: 87 mL/min/{1.73_m2} (ref >59)

## 2022-04-01 NOTE — Progress Notes (Signed)
Contacted via [place] morning Mary Bradford, your labs have returned and potassium level remains stable.  Continue medication.  Any questions? Keep being amazing!!  Thank you for allowing me to participate in your care.  I appreciate you. Kindest regards, Analiya Porco

## 2022-08-06 NOTE — Patient Instructions (Signed)
Urinary Tract Infection, Adult A urinary tract infection (UTI) is an infection of any part of the urinary tract. The urinary tract includes: The kidneys. The ureters. The bladder. The urethra. These organs make, store, and get rid of pee (urine) in the body. What are the causes? This infection is caused by germs (bacteria) in your genital area. These germs grow and cause swelling (inflammation) of your urinary tract. What increases the risk? The following factors may make you more likely to develop this condition: Using a small, thin tube (catheter) to drain pee. Not being able to control when you pee or poop (incontinence). Being female. If you are female, these things can increase the risk: Using these methods to prevent pregnancy: A medicine that kills sperm (spermicide). A device that blocks sperm (diaphragm). Having low levels of a female hormone (estrogen). Being pregnant. You are more likely to develop this condition if: You have genes that add to your risk. You are sexually active. You take antibiotic medicines. You have trouble peeing because of: A prostate that is bigger than normal, if you are female. A blockage in the part of your body that drains pee from the bladder. A kidney stone. A nerve condition that affects your bladder. Not getting enough to drink. Not peeing often enough. You have other conditions, such as: Diabetes. A weak disease-fighting system (immune system). Sickle cell disease. Gout. Injury of the spine. What are the signs or symptoms? Symptoms of this condition include: Needing to pee right away. Peeing small amounts often. Pain or burning when peeing. Blood in the pee. Pee that smells bad or not like normal. Trouble peeing. Pee that is cloudy. Fluid coming from the vagina, if you are female. Pain in the belly or lower back. Other symptoms include: Vomiting. Not feeling hungry. Feeling mixed up (confused). This may be the first symptom in  older adults. Being tired and grouchy (irritable). A fever. Watery poop (diarrhea). How is this treated? Taking antibiotic medicine. Taking other medicines. Drinking enough water. In some cases, you may need to see a specialist. Follow these instructions at home:  Medicines Take over-the-counter and prescription medicines only as told by your doctor. If you were prescribed an antibiotic medicine, take it as told by your doctor. Do not stop taking it even if you start to feel better. General instructions Make sure you: Pee until your bladder is empty. Do not hold pee for a long time. Empty your bladder after sex. Wipe from front to back after peeing or pooping if you are a female. Use each tissue one time when you wipe. Drink enough fluid to keep your pee pale yellow. Keep all follow-up visits. Contact a doctor if: You do not get better after 1-2 days. Your symptoms go away and then come back. Get help right away if: You have very bad back pain. You have very bad pain in your lower belly. You have a fever. You have chills. You feeling like you will vomit or you vomit. Summary A urinary tract infection (UTI) is an infection of any part of the urinary tract. This condition is caused by germs in your genital area. There are many risk factors for a UTI. Treatment includes antibiotic medicines. Drink enough fluid to keep your pee pale yellow. This information is not intended to replace advice given to you by your health care provider. Make sure you discuss any questions you have with your health care provider. Document Revised: 10/24/2019 Document Reviewed: 10/24/2019 Elsevier Patient Education    2023 [employer] Inc[employer]  

## 2022-08-07 ENCOUNTER — Ambulatory Visit: Payer: BC Managed Care – PPO | Admitting: Nurse Practitioner

## 2022-08-07 ENCOUNTER — Encounter: Payer: Self-pay | Admitting: Nurse Practitioner

## 2022-08-07 VITALS — BP 123/84 | HR 95 | Temp 97.9°F | Ht 67.99 in | Wt 266.7 lb

## 2022-08-07 DIAGNOSIS — R399 Unspecified symptoms and signs involving the genitourinary system: Secondary | ICD-10-CM | POA: Diagnosis not present

## 2022-08-07 DIAGNOSIS — R8281 Pyuria: Secondary | ICD-10-CM

## 2022-08-07 LAB — MICROSCOPIC EXAMINATION: Bacteria, UA: NONE SEEN

## 2022-08-07 LAB — URINALYSIS, ROUTINE W REFLEX MICROSCOPIC
Bilirubin, UA: NEGATIVE
Glucose, UA: NEGATIVE
Ketones, UA: NEGATIVE
Nitrite, UA: NEGATIVE
Specific Gravity, UA: >1.03 — ABNORMAL HIGH (ref 1.005–1.030)
Urobilinogen, Ur: 0.2 mg/dL (ref 0.2–1.0)
pH, UA: 5.5 (ref 5.0–7.5)

## 2022-08-07 LAB — WET PREP FOR TRICH, YEAST, CLUE
Clue Cell Exam: NEGATIVE
Trichomonas Exam: NEGATIVE
Yeast Exam: NEGATIVE

## 2022-08-07 MED ORDER — NITROFURANTOIN MONOHYD MACRO 100 MG PO CAPS: 100.0000 mg | ORAL_CAPSULE | Freq: Two times a day (BID) | ORAL | 0 refills | Status: AC

## 2022-08-07 NOTE — Progress Notes (Signed)
BP 123/84   Pulse 95   Temp 97.9 F (36.6 C) (Oral)   Ht 5' 7.99" (1.727 m)   Wt 266 lb 11.2 oz (121 kg)   SpO2 100%   BMI 40.56 kg/m    Subjective:    Patient ID: Mary Bradford, female    DOB: 06-Aug-1985, 37 y.o.   MRN: [id number]  HPI: Mary Bradford is a 38 y.o. female  Chief Complaint  Patient presents with   Back Pain    For past 4 days mainly at night along with low abd pain, frequent urination for 3 days   URINARY SYMPTOMS Started with back ache Thursday, although had just been carrying around her toddler.  Then became worse Saturday and Sunday night.   Dysuria: burning Urinary frequency: yes Urgency: yes Small volume voids: yes Symptom severity: yes Urinary incontinence:  a little bit Foul odor: no Hematuria: no Abdominal pain: yes Back pain: yes Suprapubic pain/pressure: yes Flank pain: no Fever:  no Vomiting: no Status: stable Previous urinary tract infection: yes Recurrent urinary tract infection: no Sexual activity: monogamous History of sexually transmitted disease: no Treatments attempted: increasing fluids    Relevant past medical, surgical, family and social history reviewed and updated as indicated. Interim medical history since our last visit reviewed. Allergies and medications reviewed and updated.  Review of Systems  Constitutional:  Negative for activity change, appetite change, diaphoresis, fatigue and fever.  Respiratory:  Negative for cough, chest tightness and shortness of breath.   Cardiovascular:  Negative for chest pain, palpitations and leg swelling.  Gastrointestinal: Negative.   Genitourinary:  Positive for decreased urine volume, dysuria, frequency and urgency. Negative for hematuria.  Neurological: Negative.   Psychiatric/Behavioral: Negative.      Per HPI unless specifically indicated above     Objective:    BP 123/84   Pulse 95   Temp 97.9 F (36.6 C) (Oral)   Ht 5' 7.99" (1.727 m)   Wt 266 lb 11.2  oz (121 kg)   SpO2 100%   BMI 40.56 kg/m   Wt Readings from Last 3 Encounters:  08/07/22 266 lb 11.2 oz (121 kg)  03/31/22 264 lb 4.8 oz (119.9 kg)  01/03/22 267 lb 3.2 oz (121.2 kg)    Physical Exam Vitals and nursing note reviewed.  Constitutional:      General: She is awake. She is not in acute distress.    Appearance: She is well-developed and well-groomed. She is obese. She is not ill-appearing or toxic-appearing.  HENT:     Head: Normocephalic.     Right Ear: Hearing normal.     Left Ear: Hearing normal.  Eyes:     General: Lids are normal.        Right eye: No discharge.        Left eye: No discharge.     Conjunctiva/sclera: Conjunctivae normal.     Pupils: Pupils are equal, round, and reactive to light.  Neck:     Thyroid: No thyromegaly.     Vascular: No carotid bruit.  Cardiovascular:     Rate and Rhythm: Normal rate and regular rhythm.     Heart sounds: Normal heart sounds. No murmur heard.    No gallop.  Pulmonary:     Effort: Pulmonary effort is normal. No accessory muscle usage or respiratory distress.     Breath sounds: Normal breath sounds.  Abdominal:     General: Bowel sounds are normal. There is no distension.  Palpations: Abdomen is soft.     Tenderness: There is no abdominal tenderness.  Musculoskeletal:     Cervical back: Normal range of motion and neck supple.     Right lower leg: No edema.     Left lower leg: No edema.  Lymphadenopathy:     Cervical: No cervical adenopathy.  Skin:    General: Skin is warm and dry.  Neurological:     Mental Status: She is alert and oriented to person, place, and time.  Psychiatric:        Attention and Perception: Attention normal.        Mood and Affect: Mood normal.        Speech: Speech normal.        Behavior: Behavior normal. Behavior is cooperative.        Thought Content: Thought content normal.    Results for orders placed or performed in visit on 08/07/22  WET PREP FOR TRICH, YEAST, CLUE    Specimen: Urine   Urine  Result Value Ref Range   Trichomonas Exam Negative Negative   Yeast Exam Negative Negative   Clue Cell Exam Negative Negative  Urine Culture   Specimen: Urine   UR  Result Value Ref Range   Urine Culture, Routine Preliminary report    Organism ID, Bacteria Comment   Microscopic Examination   Urine  Result Value Ref Range   WBC, UA 11-30 (A) 0 - 5 /hpf   RBC, Urine 0-2 0 - 2 /hpf   Epithelial Cells (non renal) 0-10 0 - 10 /hpf   Mucus, UA Present (A) Not Estab.   Bacteria, UA None seen None seen/Few  Urinalysis, Routine w reflex microscopic  Result Value Ref Range   Specific Gravity, UA >1.030 (H) 1.005 - 1.030   pH, UA 5.5 5.0 - 7.5   Color, UA Yellow Yellow   Appearance Ur Turbid (A) Clear   Leukocytes,UA 1+ (A) Negative   Protein,UA 3+ (A) Negative/Trace   Glucose, UA Negative Negative   Ketones, UA Negative Negative   RBC, UA Trace (A) Negative   Bilirubin, UA Negative Negative   Urobilinogen, Ur 0.2 0.2 - 1.0 mg/dL   Nitrite, UA Negative Negative   Microscopic Examination See below:       Assessment & Plan:   Problem List Items Addressed This Visit       Other   Urinary tract infection symptoms - Primary    Acute with symptoms for 5 days.  Wet prep negative.  UA trace BLD, 3+ PRT, 1+ LEUKS, WBC 11-30.  Will send for culture to further assess.  Due to symptoms start Macrobid 100 MG BID for 3 days and adjust regimen as needed based on culture results.  Not current pregnant.  Discussed treatment plan with patient.      Relevant Orders   Urinalysis, Routine w reflex microscopic (Completed)   WET PREP FOR TRICH, YEAST, CLUE (Completed)   Other Visit Diagnoses     Pyuria       Urine sent for culture.   Relevant Orders   Urine Culture (Completed)        Follow up plan: Return if symptoms worsen or fail to improve.

## 2022-08-07 NOTE — Assessment & Plan Note (Addendum)
Acute with symptoms for 5 days.  Wet prep negative.  UA trace BLD, 3+ PRT, 1+ LEUKS, WBC 11-30.  Will send for culture to further assess.  Due to symptoms start Macrobid 100 MG BID for 3 days and adjust regimen as needed based on culture results.  Not current pregnant.  Discussed treatment plan with patient.

## 2022-08-09 ENCOUNTER — Encounter: Payer: Self-pay | Admitting: Nurse Practitioner

## 2022-08-09 LAB — URINE CULTURE

## 2022-08-09 NOTE — Progress Notes (Signed)
Contacted via MyChart   Good evening Mary Bradford, your labs have returned and looks like Macrobid is susceptible to what is growing in urine.  Good news.  Continue this until completed and if ongoing symptoms then let me know.  Any questions?

## 2022-10-05 ENCOUNTER — Encounter: Payer: Self-pay | Admitting: Nurse Practitioner

## 2022-12-30 NOTE — Patient Instructions (Signed)
Be Involved in Caring For Your Health:  Taking Medications When medications are taken as directed, they can greatly improve your health. But if they are not taken as prescribed, they may not work. In some cases, not taking them correctly can be harmful. To help ensure your treatment remains effective and safe, understand your medications and how to take them. Bring your medications to each visit for review by your provider.  Your lab results, notes, and after visit summary will be available on My Chart. We strongly encourage you to use this feature. If lab results are abnormal the clinic will contact you with the appropriate steps. If the clinic does not contact you assume the results are satisfactory. You can always view your results on My Chart. If you have questions regarding your health or results, please contact the clinic during office hours. You can also ask questions on My Chart.  We at [facility] are grateful that you chose [place] to provide your care. We strive to provide evidence-based and compassionate care and are always looking for feedback. If you get a survey from the clinic please complete this so we can hear your opinions.  Healthy Eating, Adult Healthy eating may help you get and keep a healthy body weight, reduce the risk of chronic disease, and live a long and productive life. It is important to follow a healthy eating pattern. Your nutritional and calorie needs should be met mainly by different nutrient-rich foods. What are tips for following this plan? Reading food labels Read labels and choose the following: Reduced or low sodium products. Juices with 100% fruit juice. Foods with low saturated fats (<3 g per serving) and high polyunsaturated and monounsaturated fats. Foods with whole grains, such as whole wheat, cracked wheat, brown rice, and wild rice. Whole grains that are fortified with folic acid. This is recommended for females who are pregnant or who want to  become pregnant. Read labels and do not eat or drink the following: Foods or drinks with added sugars. These include foods that contain brown sugar, corn sweetener, corn syrup, dextrose, fructose, glucose, high-fructose corn syrup, honey, invert sugar, lactose, malt syrup, maltose, molasses, raw sugar, sucrose, trehalose, or turbinado sugar. Limit your intake of added sugars to less than 10% of your total daily calories. Do not eat more than the following amounts of added sugar per day: 6 teaspoons (25 g) for females. 9 teaspoons (38 g) for males. Foods that contain processed or refined starches and grains. Refined grain products, such as white flour, degermed cornmeal, white bread, and white rice. Shopping Choose nutrient-rich snacks, such as vegetables, whole fruits, and nuts. Avoid high-calorie and high-sugar snacks, such as potato chips, fruit snacks, and candy. Use oil-based dressings and spreads on foods instead of solid fats such as butter, margarine, sour cream, or cream cheese. Limit pre-made sauces, mixes, and "instant" products such as flavored rice, instant noodles, and ready-made pasta. Try more plant-protein sources, such as tofu, tempeh, black beans, edamame, lentils, nuts, and seeds. Explore eating plans such as the Mediterranean diet or vegetarian diet. Try heart-healthy dips made with beans and healthy fats like hummus and guacamole. Vegetables go great with these. Cooking Use oil to saut or stir-fry foods instead of solid fats such as butter, margarine, or lard. Try baking, boiling, grilling, or broiling instead of frying. Remove the fatty part of meats before cooking. Steam vegetables in water or broth. Meal planning  At meals, imagine dividing your plate into fourths: One-half of  your plate is fruits and vegetables. One-fourth of your plate is whole grains. One-fourth of your plate is protein, especially lean meats, poultry, eggs, tofu, beans, or nuts. Include low-fat  dairy as part of your daily diet. Lifestyle Choose healthy options in all settings, including home, work, school, restaurants, or stores. Prepare your food safely: Wash your hands after handling raw meats. Where you prepare food, keep surfaces clean by regularly washing with hot, soapy water. Keep raw meats separate from ready-to-eat foods, such as fruits and vegetables. Cook seafood, meat, poultry, and eggs to the recommended temperature. Get a food thermometer. Store foods at safe temperatures. In general: Keep cold foods at 48F (4.4C) or below. Keep hot foods at 148F (60C) or above. Keep your freezer at [facility] (-17.8C) or below. Foods are not safe to eat if they have been between the temperatures of 40-148F (4.4-60C) for more than 2 hours. What foods should I eat? Fruits Aim to eat 1-2 cups of fresh, canned (in natural juice), or frozen fruits each day. One cup of fruit equals 1 small apple, 1 large banana, 8 large strawberries, 1 cup (237 g) canned fruit,  cup (82 g) dried fruit, or 1 cup (240 mL) 100% juice. Vegetables Aim to eat 2-4 cups of fresh and frozen vegetables each day, including different varieties and colors. One cup of vegetables equals 1 cup (91 g) broccoli or cauliflower florets, 2 medium carrots, 2 cups (150 g) raw, leafy greens, 1 large tomato, 1 large bell pepper, 1 large sweet potato, or 1 medium white potato. Grains Aim to eat 5-10 ounce-equivalents of whole grains each day. Examples of 1 ounce-equivalent of grains include 1 slice of bread, 1 cup (40 g) ready-to-eat cereal, 3 cups (24 g) popcorn, or  cup (93 g) cooked rice. Meats and other proteins Try to eat 5-7 ounce-equivalents of protein each day. Examples of 1 ounce-equivalent of protein include 1 egg,  oz nuts (12 almonds, 24 pistachios, or 7 walnut halves), 1/4 cup (90 g) cooked beans, 6 tablespoons (90 g) hummus or 1 tablespoon (16 g) peanut butter. A cut of meat or fish that is the size of a deck of  cards is about 3-4 ounce-equivalents (85 g). Of the protein you eat each week, try to have at least 8 sounce (227 g) of seafood. This is about 2 servings per week. This includes salmon, trout, herring, sardines, and anchovies. Dairy Aim to eat 3 cup-equivalents of fat-free or low-fat dairy each day. Examples of 1 cup-equivalent of dairy include 1 cup (240 mL) milk, 8 ounces (250 g) yogurt, 1 ounces (44 g) natural cheese, or 1 cup (240 mL) fortified soy milk. Fats and oils Aim for about 5 teaspoons (21 g) of fats and oils per day. Choose monounsaturated fats, such as canola and olive oils, mayonnaise made with olive oil or avocado oil, avocados, peanut butter, and most nuts, or polyunsaturated fats, such as sunflower, corn, and soybean oils, walnuts, pine nuts, sesame seeds, sunflower seeds, and flaxseed. Beverages Aim for 6 eight-ounce glasses of water per day. Limit coffee to 3-5 eight-ounce cups per day. Limit caffeinated beverages that have added calories, such as soda and energy drinks. If you drink alcohol: Limit how much you have to: 0-1 drink a day if you are female. 0-2 drinks a day if you are female. Know how much alcohol is in your drink. In the [place] one drink is one 12 oz bottle of beer (355 mL), one 5 oz glass of wine (  148 mL), or one 1 oz glass of hard liquor (44 mL). Seasoning and other foods Try not to add too much salt to your food. Try using herbs and spices instead of salt. Try not to add sugar to food. This information is based on [place] nutrition guidelines. To learn more, visit [employer]. Exact amounts may vary. You may need different amounts. This information is not intended to replace advice given to you by your health care provider. Make sure you discuss any questions you have with your health care provider. Document Revised: 12/12/2021 Document Reviewed: 12/12/2021 Elsevier Patient Education  2024 [employer].

## 2023-01-01 ENCOUNTER — Encounter: Payer: Self-pay | Admitting: Nurse Practitioner

## 2023-01-01 ENCOUNTER — Ambulatory Visit (INDEPENDENT_AMBULATORY_CARE_PROVIDER_SITE_OTHER): Payer: BC Managed Care – PPO | Admitting: Nurse Practitioner

## 2023-01-01 VITALS — BP 122/86 | HR 98 | Temp 98.2°F | Ht 67.6 in | Wt 274.2 lb

## 2023-01-01 DIAGNOSIS — E66813 Obesity, class 3: Secondary | ICD-10-CM

## 2023-01-01 DIAGNOSIS — Z6841 Body Mass Index (BMI) 40.0 and over, adult: Secondary | ICD-10-CM | POA: Diagnosis not present

## 2023-01-01 DIAGNOSIS — E282 Polycystic ovarian syndrome: Secondary | ICD-10-CM

## 2023-01-01 DIAGNOSIS — Z0000 Encounter for general adult medical examination without abnormal findings: Secondary | ICD-10-CM

## 2023-01-01 DIAGNOSIS — Z23 Encounter for immunization: Secondary | ICD-10-CM | POA: Diagnosis not present

## 2023-01-01 DIAGNOSIS — L658 Other specified nonscarring hair loss: Secondary | ICD-10-CM

## 2023-01-01 DIAGNOSIS — G43009 Migraine without aura, not intractable, without status migrainosus: Secondary | ICD-10-CM

## 2023-01-01 MED ORDER — SPIRONOLACTONE 50 MG PO TABS: 50.0000 mg | ORAL_TABLET | Freq: Every day | ORAL | 4 refills | Status: DC

## 2023-01-01 NOTE — Assessment & Plan Note (Signed)
Stable.  Suspect related to PCOS, improved with Spironolactone on board.  Check labs today to assess K+ level.  CBC, CMP, A1c today.

## 2023-01-01 NOTE — Assessment & Plan Note (Signed)
Chronic, stable with no medications at this time.  Continue to monitor and adjust regimen as needed based on symptoms and frequency.

## 2023-01-01 NOTE — Assessment & Plan Note (Signed)
Recommend  focus on healthy diet choices and regular physical activity (30 minutes 5 days a week).  Focus on small goals at a time.  

## 2023-01-01 NOTE — Progress Notes (Signed)
BP 122/86   Pulse 98   Temp 98.2 F (36.8 C) (Oral)   Ht 5' 7.6" (1.717 m)   Wt 274 lb 3.2 oz (124.4 kg)   LMP 12/11/2022 (Approximate)   SpO2 98%   Breastfeeding No   BMI 42.19 kg/m    Subjective:    Patient ID: Mary Bradford, female    DOB: Apr 16, 1985, 37 y.o.   MRN: [id number]  HPI: Mary Bradford is a 37 y.o. female presenting on 01/01/2023 for comprehensive medical examination. Current medical complaints include:none  She currently lives with: significant other Menopausal Symptoms: no  Continues on Spironolactone for PCOS and female pattern hair loss.  History of migraines, no recent issues and no medications at this time.  Depression Screen done today and results listed below:     01/01/2023    1:12 PM 08/07/2022    3:32 PM 03/31/2022    4:21 PM 01/03/2022    2:27 PM 08/26/2020    4:40 PM  Depression screen PHQ 2/9  Decreased Interest 0 1 1 0 0  Down, Depressed, Hopeless 1 2 1 1  0  PHQ - 2 Score 1 3 2 1  0  Altered sleeping 1 1 0 3 1  Tired, decreased energy 1 1 1 3 1   Change in appetite 0 0 0 0 0  Feeling bad or failure about yourself  0 0 0 1 0  Trouble concentrating 0 0 0 1 0  Moving slowly or fidgety/restless 0 0 0 0 0  Suicidal thoughts  0 1 0 0  PHQ-9 Score 3 5 4 9 2   Difficult doing work/chores Not difficult at all Not difficult at all Not difficult at all Somewhat difficult Not difficult at all      01/01/2023    1:12 PM 08/07/2022    3:32 PM 03/31/2022    4:21 PM 01/03/2022    2:27 PM  GAD 7 : Generalized Anxiety Score  Nervous, Anxious, on Edge 1 2 1 2   Control/stop worrying 0 2 0 1  Worry too much - different things 1 2 1 1   Trouble relaxing 1 2 2 1   Restless 0 1 0 1  Easily annoyed or irritable 1 1 1 2   Afraid - awful might happen 0 0 0 0  Total GAD 7 Score 4 10 5 8   Anxiety Difficulty Somewhat difficult Not difficult at all Not difficult at all Somewhat difficult      02/03/2021    9:16 AM 03/16/2021   11:08 AM 01/03/2022     2:26 PM 03/31/2022    3:58 PM 01/01/2023    1:12 PM  Fall Risk  Falls in the past year?  0 0 0 0  Was there an injury with Fall?  0 0 0 0  Fall Risk Category Calculator  0 0 0 0  Fall Risk Category (Retired)  Low Low Low   (RETIRED) Patient Fall Risk Level Low fall risk  Low fall risk    Patient at Risk for Falls Due to   No Fall Risks No Fall Risks No Fall Risks  Fall risk Follow up   Falls evaluation completed Falls evaluation completed Falls evaluation completed    Functional Status Survey: Is the patient deaf or have difficulty hearing?: No Does the patient have difficulty seeing, even when wearing glasses/contacts?: No Does the patient have difficulty concentrating, remembering, or making decisions?: No Does the patient have difficulty walking or climbing stairs?: No Does the patient have difficulty dressing or  bathing?: No Does the patient have difficulty doing errands alone such as visiting a doctor's office or shopping?: No      BMI Metric Follow Up - 01/01/23 1322       BMI Metric Follow Up-Please document annually   BMI Metric Follow Up Nutrition counseling                 Past Medical History:  Past Medical History:  Diagnosis Date   Anxiety 2021   BMI 40.0-44.9, adult (HCC)    Depression    History of PCOS    Migraines    Miscarriage    2018   Newborn product of in vitro fertilization (IVF) pregnancy     Surgical History:  Past Surgical History:  Procedure Laterality Date   CESAREAN SECTION N/A 05/12/2017   Procedure: CESAREAN SECTION;  Surgeon: Osborn Coho, MD;  Location: [facility];  Service: Obstetrics;  Laterality: N/A;  RNFA Requested OPERATIVE ULTRASOUND BEFORE CESAREAN TO CONFIRM PRESENTATION   egg  retrieval     WISDOM TOOTH EXTRACTION      Medications:  No current outpatient medications on file prior to visit.   No current facility-administered medications on file prior to visit.    Allergies:  Allergies  Allergen  Reactions   Amoxicillin Other (See Comments)    Has patient had a PCN reaction causing immediate rash, facial/tongue/throat swelling, SOB or lightheadedness with hypotension: Unknown Has patient had a PCN reaction causing severe rash involving mucus membranes or skin necrosis: Unknown Has patient had a PCN reaction that required hospitalization: Unknown Has patient had a PCN reaction occurring within the last 10 years: No If all of the above answers are "NO", then may proceed with Cephalosporin use.     Social History:  Social History   Socioeconomic History   Marital status: Married    Spouse name: Alinda Money   Number of children: 1   Years of education: Not on file   Highest education level: Bachelor's degree (e.g., BA, AB, BS)  Occupational History   Occupation: Runner, broadcasting/film/video    Comment: Therapist, sports  Tobacco Use   Smoking status: Never   Smokeless tobacco: Never  Vaping Use   Vaping status: Never Used  Substance and Sexual Activity   Alcohol use: Not Currently    Alcohol/week: 1.0 standard drink of alcohol    Types: 1 Glasses of wine per week   Drug use: No   Sexual activity: Yes    Birth control/protection: None    Comment: History of infertility  Other Topics Concern   Not on file  Social History Narrative   Not on file   Social Determinants of Health   Financial Resource Strain: Medium Risk (08/06/2022)   Overall Financial Resource Strain (CARDIA)    Difficulty of Paying Living Expenses: Somewhat hard  Food Insecurity: No Food Insecurity (08/06/2022)   Hunger Vital Sign    Worried About Running Out of Food in the Last Year: Never true    Ran Out of Food in the Last Year: Never true  Transportation Needs: No Transportation Needs (08/06/2022)   PRAPARE - Administrator, Civil Service (Medical): No    Lack of Transportation (Non-Medical): No  Physical Activity: Unknown (08/06/2022)   Exercise Vital Sign    Days of Exercise per Week: Patient declined     Minutes of Exercise per Session: Not on file  Stress: Stress Concern Present (08/06/2022)   [employer] of Occupational Health - Occupational  Stress Questionnaire    Feeling of Stress : Very much  Social Connections: Moderately Integrated (08/06/2022)   Social Connection and Isolation Panel [NHANES]    Frequency of Communication with Friends and Family: Once a week    Frequency of Social Gatherings with Friends and Family: Once a week    Attends Religious Services: More than 4 times per year    Active Member of [employer] or Organizations: Yes    Attends Engineer, structural: More than 4 times per year    Marital Status: Married  Catering manager Violence: Not At Risk (03/07/2019)   Humiliation, Afraid, Rape, and Kick questionnaire    Fear of Current or Ex-Partner: No    Emotionally Abused: No    Physically Abused: No    Sexually Abused: No   Social History   Tobacco Use  Smoking Status Never  Smokeless Tobacco Never   Social History   Substance and Sexual Activity  Alcohol Use Not Currently   Alcohol/week: 1.0 standard drink of alcohol   Types: 1 Glasses of wine per week    Family History:  Family History  Problem Relation Age of Onset   Kidney Stones Mother    Hypertension Mother    Hypertension Father    Kidney Stones Father    Migraines Father    Diabetes Father    Obesity Father    Irritable bowel syndrome Sister    Migraines Sister    Kidney disease Sister    Leukemia Sister    Cancer Sister    Early death Sister    Obesity Sister    Heart disease Maternal Grandmother    Diabetes Maternal Grandmother    Heart failure Maternal Grandmother    Diabetes Maternal Grandfather    Dementia Maternal Grandfather    Non-Hodgkin's lymphoma Paternal Grandfather    Leukemia Paternal Grandfather    Hodgkin's lymphoma Paternal Grandfather    Cancer Paternal Grandfather    Hearing loss Paternal Grandfather    Leukemia Maternal Uncle    Cancer Maternal Uncle     Early death Maternal Uncle     Past medical history, surgical history, medications, allergies, family history and social history reviewed with patient today and changes made to appropriate areas of the chart.   ROS All other ROS negative except what is listed above and in the HPI.      Objective:    BP 122/86   Pulse 98   Temp 98.2 F (36.8 C) (Oral)   Ht 5' 7.6" (1.717 m)   Wt 274 lb 3.2 oz (124.4 kg)   LMP 12/11/2022 (Approximate)   SpO2 98%   Breastfeeding No   BMI 42.19 kg/m   Wt Readings from Last 3 Encounters:  01/01/23 274 lb 3.2 oz (124.4 kg)  08/07/22 266 lb 11.2 oz (121 kg)  03/31/22 264 lb 4.8 oz (119.9 kg)    Physical Exam Vitals and nursing note reviewed. Exam conducted with a chaperone present.  Constitutional:      General: She is awake. She is not in acute distress.    Appearance: She is well-developed and well-groomed. She is obese. She is not ill-appearing or toxic-appearing.  HENT:     Head: Normocephalic and atraumatic.     Right Ear: Hearing, tympanic membrane, ear canal and external ear normal. No drainage.     Left Ear: Hearing, tympanic membrane, ear canal and external ear normal. No drainage.     Nose: Nose normal.     Right  Sinus: No maxillary sinus tenderness or frontal sinus tenderness.     Left Sinus: No maxillary sinus tenderness or frontal sinus tenderness.     Mouth/Throat:     Mouth: Mucous membranes are moist.     Pharynx: Oropharynx is clear. Uvula midline. No pharyngeal swelling, oropharyngeal exudate or posterior oropharyngeal erythema.  Eyes:     General: Lids are normal.        Right eye: No discharge.        Left eye: No discharge.     Extraocular Movements: Extraocular movements intact.     Conjunctiva/sclera: Conjunctivae normal.     Pupils: Pupils are equal, round, and reactive to light.     Visual Fields: Right eye visual fields normal and left eye visual fields normal.  Neck:     Thyroid: No thyromegaly.     Vascular:  No carotid bruit.     Trachea: Trachea normal.  Cardiovascular:     Rate and Rhythm: Normal rate and regular rhythm.     Heart sounds: Normal heart sounds. No murmur heard.    No gallop.  Pulmonary:     Effort: Pulmonary effort is normal. No accessory muscle usage or respiratory distress.     Breath sounds: Normal breath sounds.  Chest:  Breasts:    Right: Normal.     Left: Normal.  Abdominal:     General: Bowel sounds are normal.     Palpations: Abdomen is soft. There is no hepatomegaly or splenomegaly.     Tenderness: There is no abdominal tenderness.  Musculoskeletal:        General: Normal range of motion.     Cervical back: Normal range of motion and neck supple.     Right lower leg: No edema.     Left lower leg: No edema.  Lymphadenopathy:     Head:     Right side of head: No submental, submandibular, tonsillar, preauricular or posterior auricular adenopathy.     Left side of head: No submental, submandibular, tonsillar, preauricular or posterior auricular adenopathy.     Cervical: No cervical adenopathy.     Upper Body:     Right upper body: No supraclavicular, axillary or pectoral adenopathy.     Left upper body: No supraclavicular, axillary or pectoral adenopathy.  Skin:    General: Skin is warm and dry.     Capillary Refill: Capillary refill takes less than 2 seconds.     Findings: No rash.  Neurological:     Mental Status: She is alert and oriented to person, place, and time.     Gait: Gait is intact.     Deep Tendon Reflexes: Reflexes are normal and symmetric.     Reflex Scores:      Brachioradialis reflexes are 2+ on the right side and 2+ on the left side.      Patellar reflexes are 2+ on the right side and 2+ on the left side. Psychiatric:        Attention and Perception: Attention normal.        Mood and Affect: Mood normal.        Speech: Speech normal.        Behavior: Behavior normal. Behavior is cooperative.        Thought Content: Thought content  normal.        Judgment: Judgment normal.     Results for orders placed or performed in visit on 08/07/22  WET PREP FOR TRICH, YEAST, CLUE  Specimen: Urine   Urine  Result Value Ref Range   Trichomonas Exam Negative Negative   Yeast Exam Negative Negative   Clue Cell Exam Negative Negative  Urine Culture   Specimen: Urine   UR  Result Value Ref Range   Urine Culture, Routine Final report (A)    Organism ID, Bacteria Comment (A)   Microscopic Examination   Urine  Result Value Ref Range   WBC, UA 11-30 (A) 0 - 5 /hpf   RBC, Urine 0-2 0 - 2 /hpf   Epithelial Cells (non renal) 0-10 0 - 10 /hpf   Mucus, UA Present (A) Not Estab.   Bacteria, UA None seen None seen/Few  Urinalysis, Routine w reflex microscopic  Result Value Ref Range   Specific Gravity, UA >1.030 (H) 1.005 - 1.030   pH, UA 5.5 5.0 - 7.5   Color, UA Yellow Yellow   Appearance Ur Turbid (A) Clear   Leukocytes,UA 1+ (A) Negative   Protein,UA 3+ (A) Negative/Trace   Glucose, UA Negative Negative   Ketones, UA Negative Negative   RBC, UA Trace (A) Negative   Bilirubin, UA Negative Negative   Urobilinogen, Ur 0.2 0.2 - 1.0 mg/dL   Nitrite, UA Negative Negative   Microscopic Examination See below:       Assessment & Plan:   Problem List Items Addressed This Visit       Cardiovascular and Mediastinum   Migraines    Chronic, stable with no medications at this time.  Continue to monitor and adjust regimen as needed based on symptoms and frequency.      Relevant Medications   spironolactone (ALDACTONE) 50 MG tablet   Other Relevant Orders   CBC with Differential/Platelet   Comprehensive metabolic panel     Endocrine   PCOS (polycystic ovarian syndrome)    Chronic, stable with Spironolactone for hair loss.  Continue this and monitor labs.  CMP, CBC, A1c today.  Refills sent in.      Relevant Orders   TSH   Lipid Panel w/o Chol/HDL Ratio   HgB A1c     Musculoskeletal and Integument   Female pattern  hair loss    Stable.  Suspect related to PCOS, improved with Spironolactone on board.  Check labs today to assess K+ level.  CBC, CMP, A1c today.        Other   BMI 40.0-44.9, adult (HCC)    Recommend  focus on healthy diet choices and regular physical activity (30 minutes 5 days a week).  Focus on small goals at a time.       Relevant Orders   TSH   Lipid Panel w/o Chol/HDL Ratio   Obesity - Primary    BMI 40.20.  Recommended eating smaller high protein, low fat meals more frequently and exercising 30 mins a day 5 times a week with a goal of 10-15lb weight loss in the next 3 months. Patient voiced their understanding and motivation to adhere to these recommendations.       Relevant Orders   CBC with Differential/Platelet   Comprehensive metabolic panel   TSH   Lipid Panel w/o Chol/HDL Ratio   Other Visit Diagnoses     Flu vaccine need       Flu vaccine today, educated on this.   Relevant Orders   Flu vaccine trivalent PF, 6mos and older(Flulaval,Afluria,Fluarix,Fluzone) (Completed)   Encounter for annual physical exam       Annual physical today with labs and health maintenance  reviewed, discussed with patient.        Follow up plan: Return in about 1 year (around 01/01/2024) for Annual Exam.   LABORATORY TESTING:  - Pap smear: up to date  IMMUNIZATIONS:   - Tdap: Tetanus vaccination status reviewed: last tetanus booster within 10 years. - Influenza: Up to date - Pneumovax: Not applicable - Prevnar: Not applicable - COVID: Up to date - HPV: Not applicable - Shingrix vaccine: Not applicable  SCREENING: -Mammogram: Not applicable  - Colonoscopy: Not applicable  - Bone Density: Not applicable  -Hearing Test: Not applicable  -Spirometry: Not applicable   PATIENT COUNSELING:   Advised to take 1 mg of folate supplement per day if capable of pregnancy.   Sexuality: Discussed sexually transmitted diseases, partner selection, use of condoms, avoidance of  unintended pregnancy  and contraceptive alternatives.   Advised to avoid cigarette smoking.  I discussed with the patient that most people either abstain from alcohol or drink within safe limits (<=14/week and <=4 drinks/occasion for males, <=7/weeks and <= 3 drinks/occasion for females) and that the risk for alcohol disorders and other health effects rises proportionally with the number of drinks per week and how often a drinker exceeds daily limits.  Discussed cessation/primary prevention of drug use and availability of treatment for abuse.   Diet: Encouraged to adjust caloric intake to maintain  or achieve ideal body weight, to reduce intake of dietary saturated fat and total fat, to limit sodium intake by avoiding high sodium foods and not adding table salt, and to maintain adequate dietary potassium and calcium preferably from fresh fruits, vegetables, and low-fat dairy products.    Stressed the importance of regular exercise  Injury prevention: Discussed safety belts, safety helmets, smoke detector, smoking near bedding or upholstery.   Dental health: Discussed importance of regular tooth brushing, flossing, and dental visits.    NEXT PREVENTATIVE PHYSICAL DUE IN 1 YEAR. Return in about 1 year (around 01/01/2024) for Annual Exam.

## 2023-01-01 NOTE — Assessment & Plan Note (Signed)
Chronic, stable with Spironolactone for hair loss.  Continue this and monitor labs.  CMP, CBC, A1c today.  Refills sent in.

## 2023-01-01 NOTE — Assessment & Plan Note (Signed)
BMI 40.20.  Recommended eating smaller high protein, low fat meals more frequently and exercising 30 mins a day 5 times a week with a goal of 10-15lb weight loss in the next 3 months. Patient voiced their understanding and motivation to adhere to these recommendations. ? ?

## 2023-01-02 ENCOUNTER — Other Ambulatory Visit: Payer: Self-pay | Admitting: Nurse Practitioner

## 2023-01-02 ENCOUNTER — Encounter: Payer: Self-pay | Admitting: Nurse Practitioner

## 2023-01-02 DIAGNOSIS — R7989 Other specified abnormal findings of blood chemistry: Secondary | ICD-10-CM

## 2023-01-02 LAB — HEMOGLOBIN A1C
Est. average glucose Bld gHb Est-mCnc: 120 mg/dL
Hgb A1c MFr Bld: 5.8 % — ABNORMAL HIGH (ref 4.8–5.6)

## 2023-01-02 LAB — COMPREHENSIVE METABOLIC PANEL
ALT: 18 [IU]/L (ref 0–32)
AST: 19 [IU]/L (ref 0–40)
Albumin: 4.4 g/dL (ref 3.9–4.9)
Alkaline Phosphatase: 98 [IU]/L (ref 44–121)
BUN/Creatinine Ratio: 12 (ref 9–23)
BUN: 9 mg/dL (ref 6–20)
Bilirubin Total: 0.2 mg/dL (ref 0.0–1.2)
CO2: 22 mmol/L (ref 20–29)
Calcium: 9.3 mg/dL (ref 8.7–10.2)
Chloride: 102 mmol/L (ref 96–106)
Creatinine, Ser: 0.73 mg/dL (ref 0.57–1.00)
Globulin, Total: 2.6 g/dL (ref 1.5–4.5)
Glucose: 106 mg/dL — ABNORMAL HIGH (ref 70–99)
Potassium: 4.4 mmol/L (ref 3.5–5.2)
Sodium: 138 mmol/L (ref 134–144)
Total Protein: 7 g/dL (ref 6.0–8.5)
eGFR: 109 mL/min/{1.73_m2} (ref >59)

## 2023-01-02 LAB — CBC WITH DIFFERENTIAL/PLATELET
Basophils Absolute: 0 10*3/uL (ref 0.0–0.2)
Basos: 0 %
EOS (ABSOLUTE): 0.2 10*3/uL (ref 0.0–0.4)
Eos: 1 %
Hematocrit: 37.5 % (ref 34.0–46.6)
Hemoglobin: 11.9 g/dL (ref 11.1–15.9)
Immature Grans (Abs): 0 10*3/uL (ref 0.0–0.1)
Immature Granulocytes: 0 %
Lymphocytes Absolute: 3.4 10*3/uL — ABNORMAL HIGH (ref 0.7–3.1)
Lymphs: 29 %
MCH: 25.8 pg — ABNORMAL LOW (ref 26.6–33.0)
MCHC: 31.7 g/dL (ref 31.5–35.7)
MCV: 81 fL (ref 79–97)
Monocytes Absolute: 0.6 10*3/uL (ref 0.1–0.9)
Monocytes: 5 %
Neutrophils Absolute: 7.6 10*3/uL — ABNORMAL HIGH (ref 1.4–7.0)
Neutrophils: 65 %
Platelets: 402 10*3/uL (ref 150–450)
RBC: 4.62 x10E6/uL (ref 3.77–5.28)
RDW: 14.2 % (ref 11.7–15.4)
WBC: 11.8 10*3/uL — ABNORMAL HIGH (ref 3.4–10.8)

## 2023-01-02 LAB — LIPID PANEL W/O CHOL/HDL RATIO
Cholesterol, Total: 144 mg/dL (ref 100–199)
HDL: 49 mg/dL (ref >39)
LDL Chol Calc (NIH): 72 mg/dL (ref 0–99)
Triglycerides: 131 mg/dL (ref 0–149)
VLDL Cholesterol Cal: 23 mg/dL (ref 5–40)

## 2023-01-02 LAB — TSH: TSH: 0.659 u[IU]/mL (ref 0.450–4.500)

## 2023-01-02 NOTE — Progress Notes (Signed)
Contacted via MyChart -- lab only visit in 4 weeks please   Good morning Mary Bradford, your labs have returned: - CBC is showing mild elevation in white blood cell count and neutrophils + lymphocytes, I would like to repeat this in 4 weeks for recheck as this can vary for many reasons.  We have seen this with you on labs in past as well.  Been sick recently? - Kidney function, creatinine and eGFR, remains normal, as is liver function, AST and ALT.  - A1c is in prediabetic level now at 5.8%, we will monitor this closely to ensure it does not get to 6.5% or greater.  Focus on healthy diet changes and regular exercise. - Thyroid and cholesterol levels look great.  Any questions? Keep being excellent!!  Thank you for allowing me to participate in your care.  I appreciate you. Kindest regards, Juventino Pavone

## 2023-01-17 ENCOUNTER — Ambulatory Visit
Admission: EM | Admit: 2023-01-17 | Discharge: 2023-01-17 | Disposition: A | Discharge status: Home / Self Care | Payer: BC Managed Care – PPO | Attending: Family Medicine | Admitting: Family Medicine

## 2023-01-17 DIAGNOSIS — S161XXA Strain of muscle, fascia and tendon at neck level, initial encounter: Secondary | ICD-10-CM | POA: Diagnosis not present

## 2023-01-17 DIAGNOSIS — M542 Cervicalgia: Secondary | ICD-10-CM

## 2023-01-17 MED ORDER — KETOROLAC TROMETHAMINE 60 MG/2ML IM SOLN
30.0000 mg | Freq: Once | INTRAMUSCULAR | Status: AC
  Administered 2023-01-17: 30 mg via INTRAMUSCULAR

## 2023-01-17 MED ORDER — CYCLOBENZAPRINE HCL 10 MG PO TABS: 10.0000 mg | ORAL_TABLET | Freq: Two times a day (BID) | ORAL | 0 refills | Status: DC | PRN

## 2023-01-17 MED ORDER — NAPROXEN 500 MG PO TABS: 500.0000 mg | ORAL_TABLET | Freq: Two times a day (BID) | ORAL | 0 refills | Status: DC

## 2023-01-17 NOTE — Discharge Instructions (Addendum)
If medication was prescribed, stop by the pharmacy to pick up your prescriptions.  For your  pain, Take 1500 mg Tylenol twice a day, take muscle relaxer (Flexeril) twice a day, take Naprosyn twice a day,  as needed for pain. Rest and elevate the affected painful area.  Apply warm compresses intermittently, as needed.  As pain recedes, begin normal activities slowly as tolerated.  Follow up with primary care provider or an orthopedic provider, if symptoms persist.

## 2023-01-17 NOTE — ED Provider Notes (Signed)
[facility]    CSN: [id number] Arrival date & time: 01/17/23  1456      History   Chief Complaint Chief Complaint  Patient presents with   Neck Pain    HPI  HPI Mary Bradford is a 37 y.o. female.   Mary Bradford presents for left sided pain that started *** days ago after ***?injury. Pain is described as *** {back pain desc:31852} and {back pain radiation:11559}. Pain rated ***/10.  Endorses {ed back pain sx:10963}. Denies {ed back pain sx:10963}.   Continues to have *** description pain with movement. Mary Bradford ***does not feel like his*** legs are weak.   Has ***never injured his/her*** back before.   Has tried {home care:60200} with {relief:12621}.  ***No change in pain day vs night.    Red flags*** Perianal numbness, cancer, unexplained weight loss, immunosuppression, prolonged use of steroids, history of IV drug use, pain that is increased or unrelieved by rest, fever, bladder or bowel incontinence, significant trauma related to age   Fever : no  Weight loss: *** Perianal numbness: *** Bowel incontinence: *** Bladder incontinence: *** Trauma: ***  Sore throat: no   Cough: no Hydration: normal  Abdominal pain: no Nausea: no Vomiting: no Abdominal pain: *** Dysuria:*** Sleep disturbance: no *** Neck Pain: no Headache: no     Past Medical History:  Diagnosis Date   Anxiety 2021   BMI 40.0-44.9, adult (HCC)    Depression    History of PCOS    Migraines    Miscarriage    2018   Newborn product of in vitro fertilization (IVF) pregnancy     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   History of depression 02/03/2021   Rh negative state in antepartum period 08/26/2020   Family history of AML (acute myeloid leukemia) 04/04/2019   Migraines 03/07/2019   BMI 40.0-44.9, adult (HCC) 03/07/2019   Obesity 03/07/2019   PCOS (polycystic ovarian syndrome) 08/04/2014    Past Surgical History:   Procedure Laterality Date   CESAREAN SECTION N/A 05/12/2017   Procedure: CESAREAN SECTION;  Surgeon: Osborn Coho, MD;  Location: [facility] BIRTHING SUITES;  Service: Obstetrics;  Laterality: N/A;  RNFA Requested OPERATIVE ULTRASOUND BEFORE CESAREAN TO CONFIRM PRESENTATION   egg  retrieval     WISDOM TOOTH EXTRACTION      OB History     Gravida  3   Para  2   Term  2   Preterm      AB  1   Living  2      SAB  1   IAB      Ectopic      Multiple  0   Live Births  2            Home Medications    Prior to Admission medications   Medication Sig Start Date End Date Taking? Authorizing Provider  spironolactone (ALDACTONE) 50 MG tablet Take 1 tablet (50 mg total) by mouth daily. 01/01/23  Yes Marjie Skiff, NP    Family History Family History  Problem Relation Age of Onset   Kidney Stones Mother    Hypertension Mother    Hypertension Father    Kidney Stones Father    Migraines Father    Diabetes Father    Obesity Father    Irritable bowel syndrome Sister    Migraines Sister    Kidney disease Sister    Leukemia Sister  Cancer Sister    Early death Sister    Obesity Sister    Heart disease Maternal Grandmother    Diabetes Maternal Grandmother    Heart failure Maternal Grandmother    Diabetes Maternal Grandfather    Dementia Maternal Grandfather    Non-Hodgkin's lymphoma Paternal Grandfather    Leukemia Paternal Grandfather    Hodgkin's lymphoma Paternal Grandfather    Cancer Paternal Grandfather    Hearing loss Paternal Grandfather    Leukemia Maternal Uncle    Cancer Maternal Uncle    Early death Maternal Uncle     Social History Social History   Tobacco Use   Smoking status: Never   Smokeless tobacco: Never  Vaping Use   Vaping status: Never Used  Substance Use Topics   Alcohol use: Not Currently    Alcohol/week: 1.0 standard drink of alcohol    Types: 1 Glasses of wine per week   Drug use: No     Allergies    Amoxicillin   Review of Systems Review of Systems: egative unless otherwise stated in HPI.      Physical Exam Triage Vital Signs ED Triage Vitals  Encounter Vitals Group     BP 01/17/23 1507 (!) 135/100     Systolic BP Percentile --      Diastolic BP Percentile --      Pulse Rate 01/17/23 1507 100     Resp 01/17/23 1507 16     Temp 01/17/23 1507 98.8 F (37.1 C)     Temp Source 01/17/23 1507 Oral     SpO2 01/17/23 1507 100 %     Weight 01/17/23 1506 274 lb 3.2 oz (124.4 kg)     Height 01/17/23 1506 5' 7.6" (1.717 m)     Head Circumference --      Peak Flow --      Pain Score --      Pain Loc --      Pain Education --      Exclude from Growth Chart --    No data found.  Updated Vital Signs BP (!) 135/100 (BP Location: Left Arm)   Pulse 100   Temp 98.8 F (37.1 C) (Oral)   Resp 16   Ht 5' 7.6" (1.717 m)   Wt 124.4 kg   LMP 12/11/2022 (Approximate)   SpO2 100%   BMI 42.19 kg/m   Visual Acuity Right Eye Distance:   Left Eye Distance:   Bilateral Distance:    Right Eye Near:   Left Eye Near:    Bilateral Near:     Physical Exam GEN: well appearing female in no acute distress  CVS: well perfused  RESP: speaking in full sentences without pause, no respiratory distress  MSK:  Lumbar spine: - Inspection: no gross deformity or asymmetry, swelling or ecchymosis. No skin changes  - Palpation: No ***TTP over the spinous processes, ***bilateral lumbar paraspinal muscles, no SI joint tenderness bilaterally - ROM: full active ROM of the lumbar spine in flexion and extension but with mild pain ***with flexion/extension  - Strength: 5/5 strength of lower extremity in L4-S1 nerve root distributions b/l - Neuro: sensation intact in the L4-S1 nerve root distribution b/l, ***2+ L4 and S1 reflexes - Special testing: Negative straight leg raise SKIN: warm, dry, no overly skin rash or erythema    UC Treatments / Results  Labs (all labs ordered are listed, but only  abnormal results are displayed) Labs Reviewed - No data to display  EKG  Radiology No results found.   Procedures Procedures (including critical care time)  Medications Ordered in UC Medications - No data to display  Initial Impression / Assessment and Plan / UC Course  I have reviewed the triage vital signs and the nursing notes.  Pertinent labs & imaging results that were available during my care of the patient were reviewed by me and considered in my medical decision making (see chart for details).      Pt is a 37 y.o.  female with *** days of *** back pain after ***.  Has history of ***low back pain.   Obtained ***lumbar plain films.  Xray personally interpreted by me were ***unremarkable for fracture, ***or significant malalignment.  Radiologist report reviewed and notes ***  Patient to gradually return to normal activities, as tolerated and continue ordinary activities within the limits permitted by pain. Prescribed Naproxen sodium *** and muscle relaxer *** for pain relief.  Advised patient to avoid other NSAIDs while taking ***Naprosyn. Tylenol and Lidocaine patches PRN for multimodal pain relief. Counseled patient on red flag symptoms and when to seek immediate care.  ***No red flags suggesting cauda equina syndrome or progressive major motor weakness. Patient to follow up with orthopedic provider if symptoms do not improve with conservative treatment.  Return and ED precautions given.    Discussed MDM, treatment plan and plan for follow-up with patient who agrees with plan.   Final Clinical Impressions(s) / UC Diagnoses   Final diagnoses:  None   Discharge Instructions   None    ED Prescriptions   None    PDMP not reviewed this encounter.

## 2023-01-17 NOTE — ED Triage Notes (Signed)
Pt c/o L sided neck pain x3 days. Denies any injuries. Has tried tylenol w/o relief.

## 2023-01-18 ENCOUNTER — Emergency Department: Payer: BC Managed Care – PPO

## 2023-01-18 ENCOUNTER — Other Ambulatory Visit: Payer: Self-pay

## 2023-01-18 DIAGNOSIS — M436 Torticollis: Secondary | ICD-10-CM | POA: Diagnosis not present

## 2023-01-18 DIAGNOSIS — M542 Cervicalgia: Secondary | ICD-10-CM | POA: Diagnosis present

## 2023-01-18 NOTE — ED Triage Notes (Signed)
Pt reports neck pain since waking on Sunday morning. Denies known trauma or injury. Reports pain worsened yesterday. Pt denies h/a, dizziness, parasthesia. Pt alert and oriented. Ambulatory. Breathing unlabored speaking in full sentences.

## 2023-01-19 ENCOUNTER — Emergency Department
Admission: EM | Admit: 2023-01-19 | Discharge: 2023-01-19 | Disposition: A | Discharge status: Home / Self Care | Payer: BC Managed Care – PPO | Attending: Emergency Medicine | Admitting: Emergency Medicine

## 2023-01-19 DIAGNOSIS — M436 Torticollis: Secondary | ICD-10-CM

## 2023-01-19 MED ORDER — LIDOCAINE 5 % EX PTCH: 1.0000 | MEDICATED_PATCH | CUTANEOUS | 0 refills | Status: DC

## 2023-01-19 MED ORDER — HYDROCODONE-ACETAMINOPHEN 5-325 MG PO TABS
1.0000 | ORAL_TABLET | Freq: Once | ORAL | Status: AC
Start: 2023-01-19 — End: 2023-01-19
  Administered 2023-01-19: 1 via ORAL
  Filled 2023-01-19: qty 1

## 2023-01-19 MED ORDER — DIAZEPAM 2 MG PO TABS
2.0000 mg | ORAL_TABLET | Freq: Once | ORAL | Status: AC
Start: 2023-01-19 — End: 2023-01-19
  Administered 2023-01-19: 2 mg via ORAL
  Filled 2023-01-19: qty 1

## 2023-01-19 MED ORDER — DIAZEPAM 2 MG PO TABS: 2.0000 mg | ORAL_TABLET | Freq: Three times a day (TID) | ORAL | 0 refills | Status: DC | PRN

## 2023-01-19 MED ORDER — HYDROCODONE-ACETAMINOPHEN 5-325 MG PO TABS: 1.0000 | ORAL_TABLET | Freq: Four times a day (QID) | ORAL | 0 refills | Status: DC | PRN

## 2023-01-19 MED ORDER — LIDOCAINE 5 % EX PTCH
1.0000 | MEDICATED_PATCH | CUTANEOUS | Status: DC
  Administered 2023-01-19: 1 via TRANSDERMAL
  Filled 2023-01-19: qty 1

## 2023-01-19 MED ORDER — KETOROLAC TROMETHAMINE 60 MG/2ML IM SOLN
30.0000 mg | Freq: Once | INTRAMUSCULAR | Status: AC
  Administered 2023-01-19: 30 mg via INTRAMUSCULAR
  Filled 2023-01-19: qty 2

## 2023-01-19 NOTE — ED Notes (Signed)
Patient discharged at this time. Ambulated to lobby with independent and steady gait. Breathing unlabored speaking in full sentences. Verbalized understanding of all discharge, follow up, and medication teaching. Discharged homed with all belongings.   

## 2023-01-19 NOTE — ED Provider Notes (Signed)
[facility] Provider Note    Event Date/Time   First MD Initiated Contact with Patient 01/19/23 0141     (approximate)   History   Neck Pain   HPI  Mary Bradford is a 37 y.o. female who presents to the ED from home with a chief complaint of nontraumatic neck pain.  Patient reports 5-day history of left-sided neck pain since awakening on Sunday morning.  Endorses muscle spasms.  Denies trauma/fall/injury.  No recent chiropractor adjustments.  Denies fever/chills, headache, vision changes, chest pain, shortness of breath, abdominal pain, nausea, vomiting or dizziness.  Denies extremity weakness/numbness/tingling.  Denies bowel or bladder incontinence.  Seen at urgent care, felt better after IM ketorolac.  Discharged on Naprosyn and Flexeril but feels like those medications are not working.     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   History of depression 02/03/2021   Rh negative state in antepartum period 08/26/2020   Family history of AML (acute myeloid leukemia) 04/04/2019   Migraines 03/07/2019   BMI 40.0-44.9, adult (HCC) 03/07/2019   Obesity 03/07/2019   PCOS (polycystic ovarian syndrome) 08/04/2014     Past Surgical History   Past Surgical History:  Procedure Laterality Date   CESAREAN SECTION N/A 05/12/2017   Procedure: CESAREAN SECTION;  Surgeon: Osborn Coho, MD;  Location: [facility];  Service: Obstetrics;  Laterality: N/A;  RNFA Requested OPERATIVE ULTRASOUND BEFORE CESAREAN TO CONFIRM PRESENTATION   egg  retrieval     WISDOM TOOTH EXTRACTION       Home Medications   Prior to Admission  medications   Medication Sig Start Date End Date Taking? Authorizing Provider  diazepam (VALIUM) 2 MG tablet Take 1 tablet (2 mg total) by mouth every 8 (eight) hours as needed for muscle spasms. 01/19/23  Yes Irean Hong, MD  HYDROcodone-acetaminophen (NORCO) 5-325 MG tablet Take 1 tablet by mouth every 6 (six) hours as needed for moderate pain (pain score 4-6). 01/19/23  Yes Irean Hong, MD  lidocaine (LIDODERM) 5 % Place 1 patch onto the skin daily. Remove & Discard patch within 12 hours or as directed by MD 01/19/23  Yes Irean Hong, MD  cyclobenzaprine (FLEXERIL) 10 MG tablet Take 1 tablet (10 mg total) by mouth 2 (two) times daily as needed for muscle spasms. 01/17/23   Katha Cabal, DO  naproxen (NAPROSYN) 500 MG tablet Take 1 tablet (500 mg total) by mouth 2 (two) times daily with a meal. 01/17/23   Brimage, Vondra, DO  spironolactone (ALDACTONE) 50 MG tablet Take 1 tablet (50 mg total) by mouth daily. 01/01/23   Marjie Skiff, NP     Allergies  Amoxicillin   Family History   Family History  Problem Relation Age of Onset   Kidney Stones Mother    Hypertension Mother    Hypertension Father    Kidney Stones Father    Migraines Father    Diabetes Father    Obesity Father    Irritable bowel syndrome Sister    Migraines Sister    Kidney  disease Sister    Leukemia Sister    Cancer Sister    Early death Sister    Obesity Sister    Heart disease Maternal Grandmother    Diabetes Maternal Grandmother    Heart failure Maternal Grandmother    Diabetes Maternal Grandfather    Dementia Maternal Grandfather    Non-Hodgkin's lymphoma Paternal Grandfather    Leukemia Paternal Grandfather    Hodgkin's lymphoma Paternal Grandfather    Cancer Paternal Grandfather    Hearing loss Paternal Grandfather    Leukemia Maternal Uncle    Cancer Maternal Uncle    Early death Maternal Uncle      Physical Exam  Triage Vital Signs: ED Triage Vitals  Encounter Vitals Group     BP  01/18/23 2229 (!) 127/98     Systolic BP Percentile --      Diastolic BP Percentile --      Pulse Rate 01/18/23 2229 92     Resp 01/18/23 2229 19     Temp 01/18/23 2229 97.9 F (36.6 C)     Temp Source 01/18/23 2229 Oral     SpO2 01/18/23 2229 100 %     Weight --      Height --      Head Circumference --      Peak Flow --      Pain Score 01/18/23 2232 7     Pain Loc --      Pain Education --      Exclude from Growth Chart --     Updated Vital Signs: BP (!) 127/98   Pulse 92   Temp 97.9 F (36.6 C) (Oral)   Resp 19   LMP 12/11/2022 (Approximate)   SpO2 100%    General: Awake, mild distress.  CV:  RRR.  Good peripheral perfusion.  Resp:  Normal effort.  CTAB. Abd:  No distention.  Other:  No midline cervical spine tenderness palpation, step-offs or deformities noted.  No carotid bruits.  Left trapezius muscle spasms.  Pain on turning head towards the left.  5/5 motor strength and sensation BUE.   ED Results / Procedures / Treatments  Labs (all labs ordered are listed, but only abnormal results are displayed) Labs Reviewed - No data to display   EKG  None   RADIOLOGY I have independently visualized and interpreted patient's imaging study as well as noted the radiology interpretation:  Cervical spine series: Negative  Official radiology report(s): DG Cervical Spine 2 or 3 views  Result Date: 01/18/2023 CLINICAL DATA:  Neck pain. EXAM: CERVICAL SPINE - 2-3 VIEW COMPARISON:  None Available. FINDINGS: Cervical spine alignment is maintained. Vertebral body heights and intervertebral disc spaces are preserved. The dens is intact. There is no evidence of fracture or focal bone abnormality. No prevertebral soft tissue edema. IMPRESSION: Negative radiographs of the cervical spine. Electronically Signed   By: Narda Rutherford M.D.   On: 01/18/2023 23:13     PROCEDURES:  Critical Care performed: No  Procedures   MEDICATIONS ORDERED IN ED: Medications  lidocaine  (LIDODERM) 5 % 1 patch (1 patch Transdermal Patch Applied 01/19/23 0157)  ketorolac (TORADOL) injection 30 mg (30 mg Intramuscular Given 01/19/23 0156)  diazepam (VALIUM) tablet 2 mg (2 mg Oral Given 01/19/23 0156)  HYDROcodone-acetaminophen (NORCO/VICODIN) 5-325 MG per tablet 1 tablet (1 tablet Oral Given 01/19/23 0156)     IMPRESSION / MDM / ASSESSMENT AND PLAN / ED COURSE  I reviewed the triage vital signs and the nursing  notes.                             37 year old female presenting with torticollis.  No focal neurological deficits on examination.  Will change her medicines from Flexeril to Valium, add Norco, patient to continue Naprosyn.  In the ED will administer IM ketorolac, Valium and Norco.  Strict return precautions given.  Patient and family member verbalized understanding and agree with plan of care.  Patient's presentation is most consistent with acute, uncomplicated illness.   FINAL CLINICAL IMPRESSION(S) / ED DIAGNOSES   Final diagnoses:  Torticollis     Rx / DC Orders   ED Discharge Orders          Ordered    diazepam (VALIUM) 2 MG tablet  Every 8 hours PRN        01/19/23 0149    HYDROcodone-acetaminophen (NORCO) 5-325 MG tablet  Every 6 hours PRN        01/19/23 0149    lidocaine (LIDODERM) 5 %  Every 24 hours        01/19/23 0151             Note:  This document was prepared using Dragon voice recognition software and may include unintentional dictation errors.   Irean Hong, MD 01/19/23 [phone]

## 2023-01-19 NOTE — Discharge Instructions (Signed)
Continue to take Naproxen as needed for pain.  Discontinue Flexeril.  Instead take Valium as needed for muscle spasms.  You may take Norco as needed for more severe pain.  Apply moist heat to affected area several times daily for further relief of symptoms.  Apply Lidocaine patch as directed.  Return to the ER for worsening symptoms, persistent vomiting, difficulty breathing, extremity weakness/numbness/tingling or other concerns.

## 2023-01-29 ENCOUNTER — Other Ambulatory Visit: Payer: BC Managed Care – PPO

## 2023-01-29 DIAGNOSIS — R7989 Other specified abnormal findings of blood chemistry: Secondary | ICD-10-CM

## 2023-01-30 ENCOUNTER — Encounter: Payer: Self-pay | Admitting: Nurse Practitioner

## 2023-01-30 DIAGNOSIS — D72820 Lymphocytosis (symptomatic): Secondary | ICD-10-CM

## 2023-01-30 LAB — CBC WITH DIFFERENTIAL/PLATELET
Basophils Absolute: 0 10*3/uL (ref 0.0–0.2)
Basos: 0 %
EOS (ABSOLUTE): 0.1 10*3/uL (ref 0.0–0.4)
Eos: 1 %
Hematocrit: 37.4 % (ref 34.0–46.6)
Hemoglobin: 11.9 g/dL (ref 11.1–15.9)
Immature Grans (Abs): 0 10*3/uL (ref 0.0–0.1)
Immature Granulocytes: 0 %
Lymphocytes Absolute: 3.6 10*3/uL — ABNORMAL HIGH (ref 0.7–3.1)
Lymphs: 31 %
MCH: 25.3 pg — ABNORMAL LOW (ref 26.6–33.0)
MCHC: 31.8 g/dL (ref 31.5–35.7)
MCV: 79 fL (ref 79–97)
Monocytes Absolute: 0.7 10*3/uL (ref 0.1–0.9)
Monocytes: 6 %
Neutrophils Absolute: 7.2 10*3/uL — ABNORMAL HIGH (ref 1.4–7.0)
Neutrophils: 62 %
Platelets: 440 10*3/uL (ref 150–450)
RBC: 4.71 x10E6/uL (ref 3.77–5.28)
RDW: 14.1 % (ref 11.7–15.4)
WBC: 11.7 10*3/uL — ABNORMAL HIGH (ref 3.4–10.8)

## 2023-01-30 LAB — SEDIMENTATION RATE: Sed Rate: 32 mm/h (ref 0–32)

## 2023-01-30 LAB — C-REACTIVE PROTEIN: CRP: 2 mg/L (ref 0–10)

## 2023-01-30 NOTE — Progress Notes (Signed)
Contacted via MyChart    Good morning [place], your white blood cell count continues to be mildly elevated, as are neutrophils and lymphocytes.  I would recommend we schedule a visit with hematology to further assess this.  Would you be okay with this?  Let me know and I will place referral to check.  These are mild elevations, but with your family history I would prefer you have a hematology visit.   Keep being amazing!!  Thank you for allowing me to participate in your care.  I appreciate you. Kindest regards, Georgios Kina

## 2023-02-02 IMAGING — US US MFM OB FOLLOW-UP
1 series · 15 of 28 positions shown · non-contrast
Comparison: none

[Series 1: us mfm ob follow-up · 28 acquisitions, 15 frames shown]
[im 1/28]
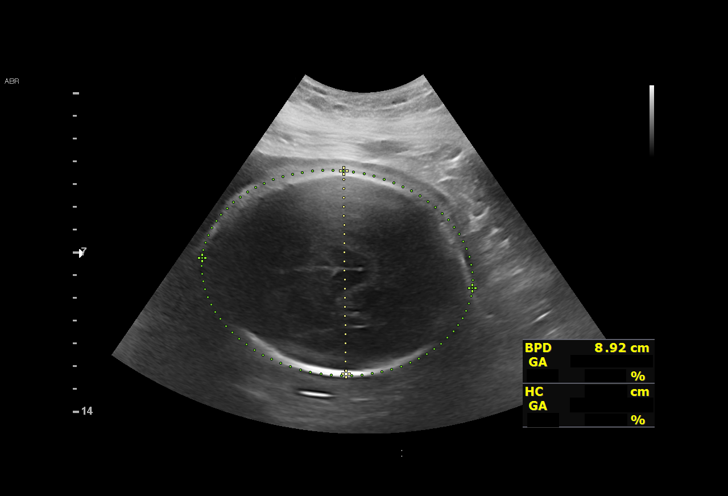
[im 3/28]
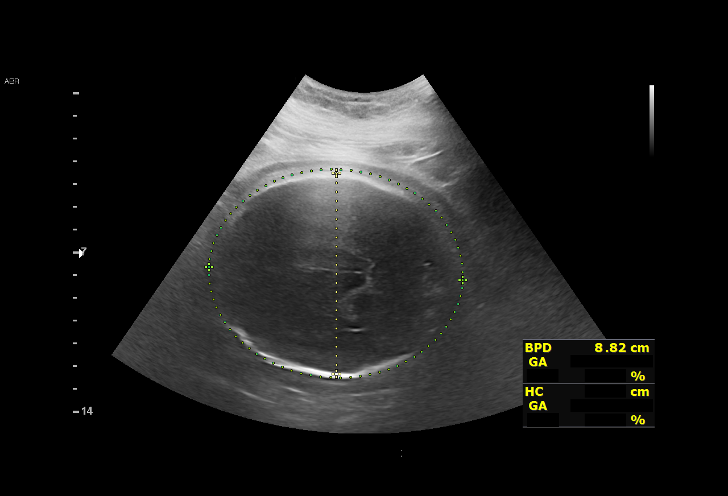
[im 5/28]
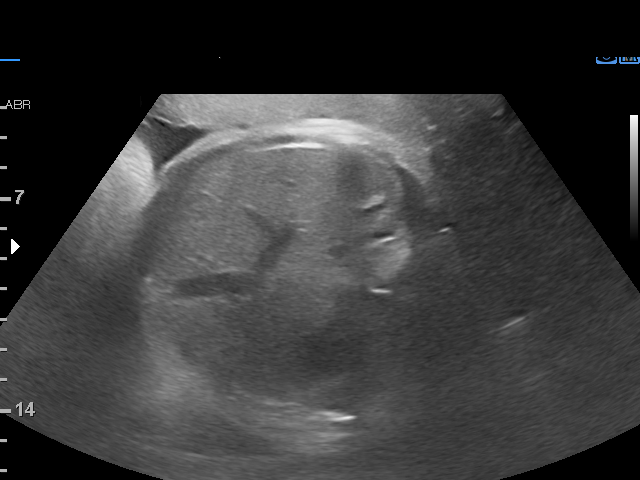
[im 7/28]
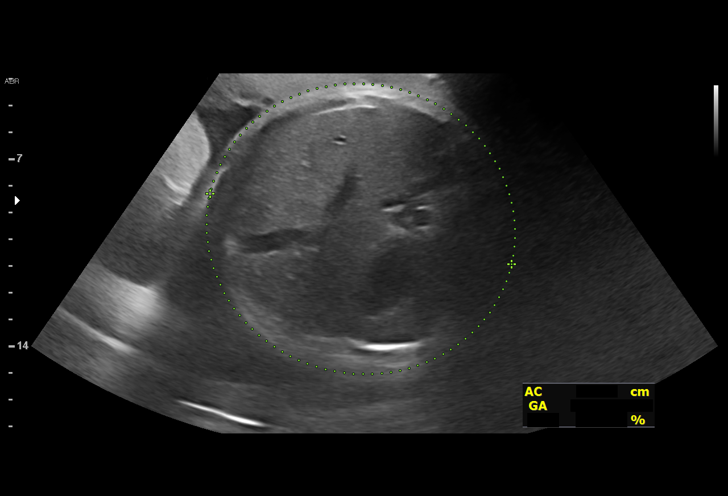
[im 9/28]
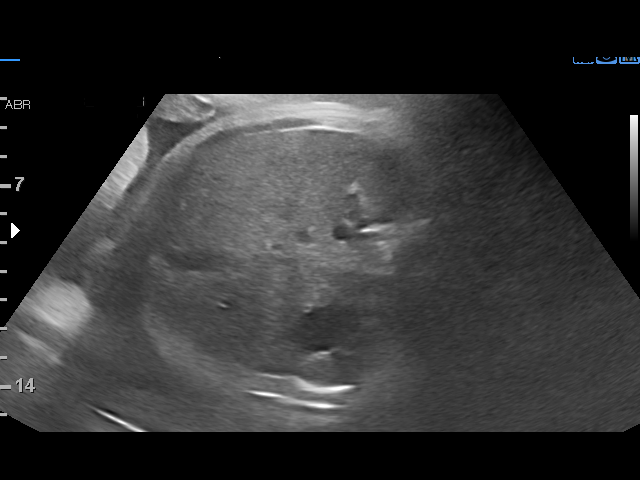
[im 11/28]
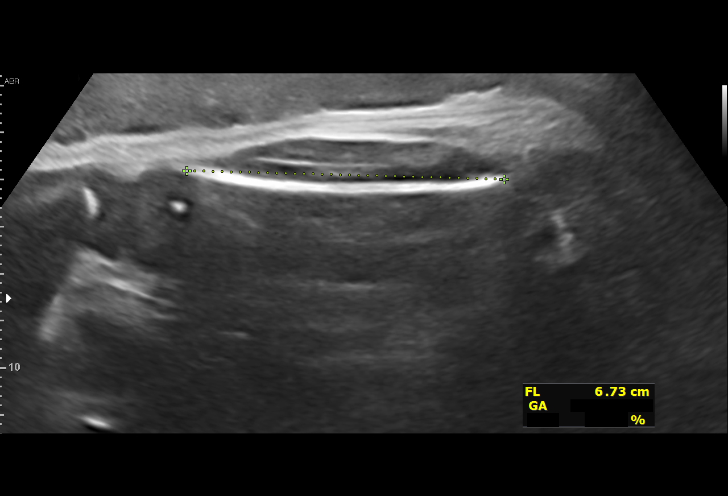
[im 13/28]
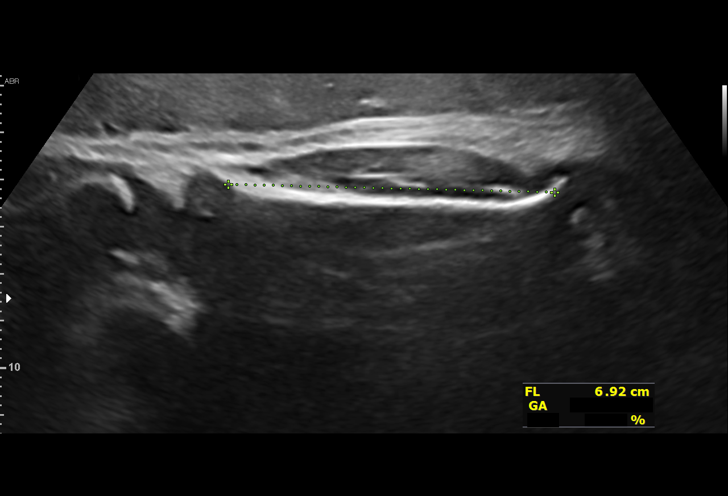
[im 15/28]
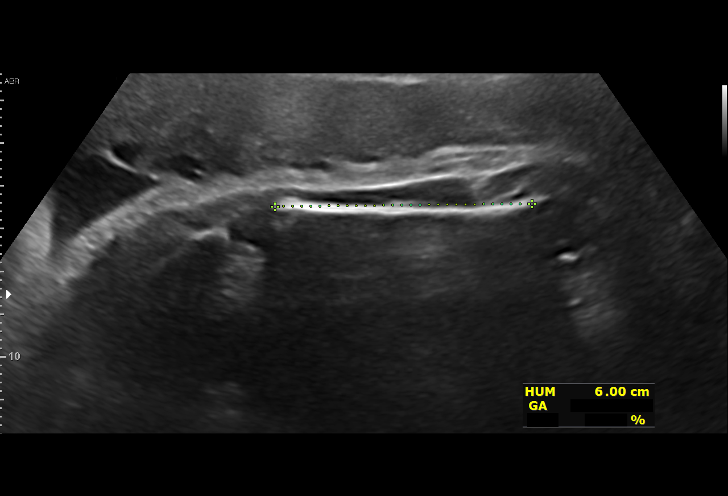
[im 16/28]
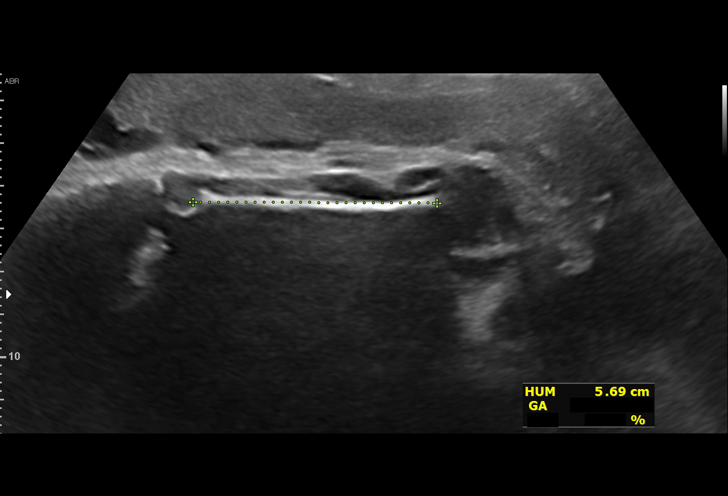
[im 18/28]
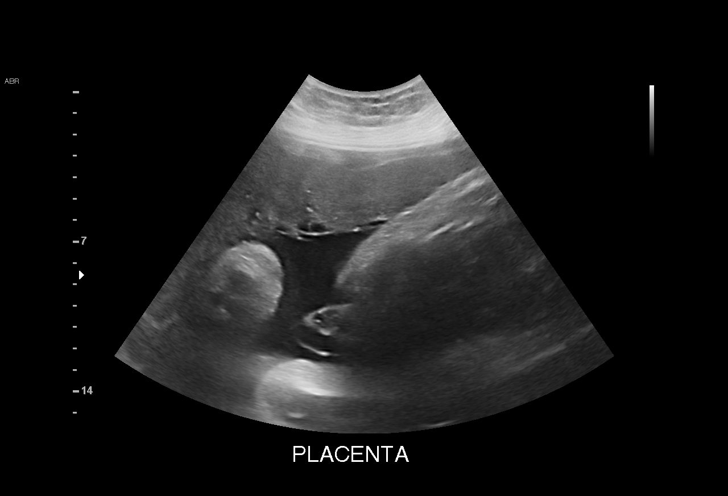
[im 20/28]
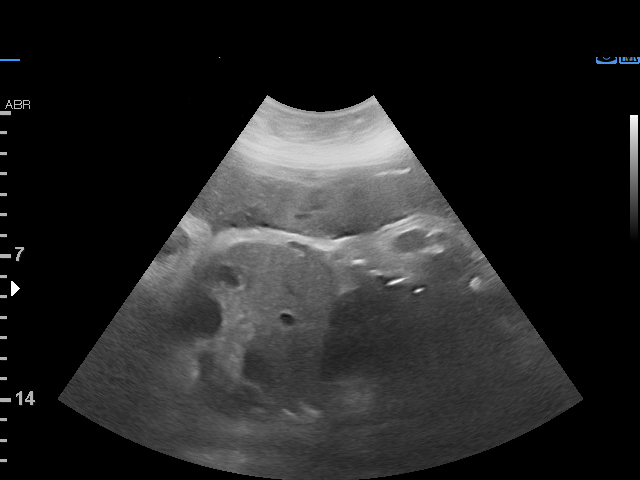
[im 22/28]
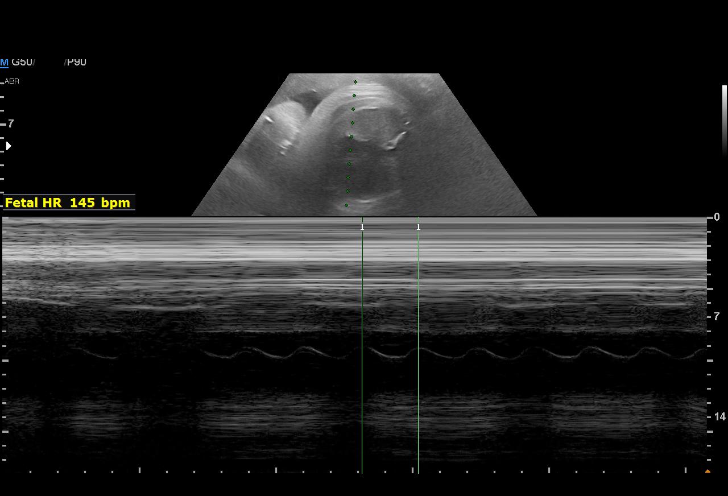
[im 24/28]
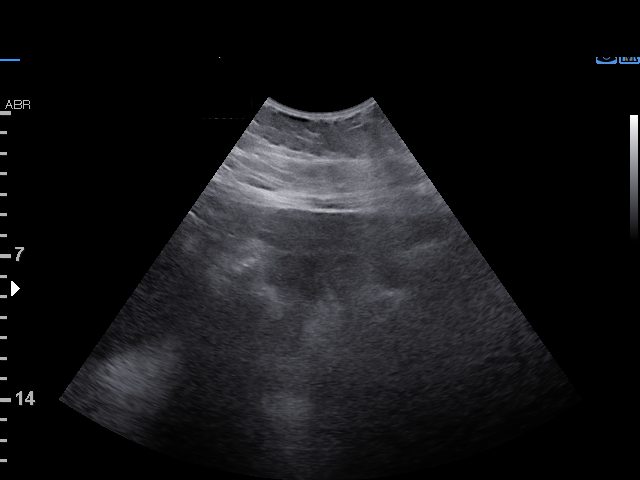
[im 26/28]
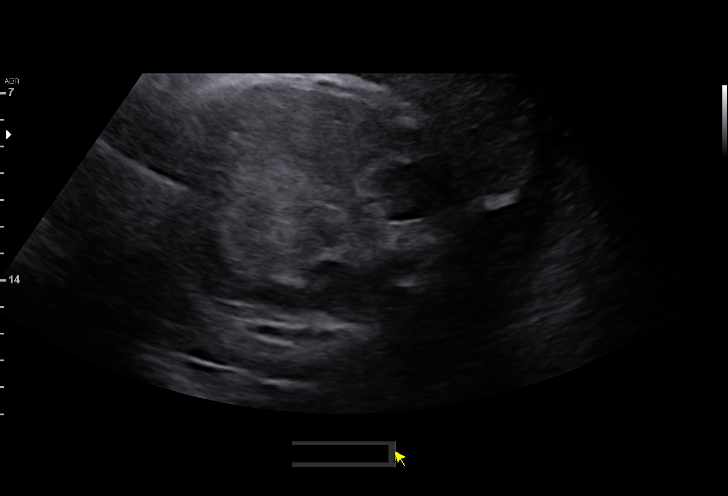
[im 28/28]
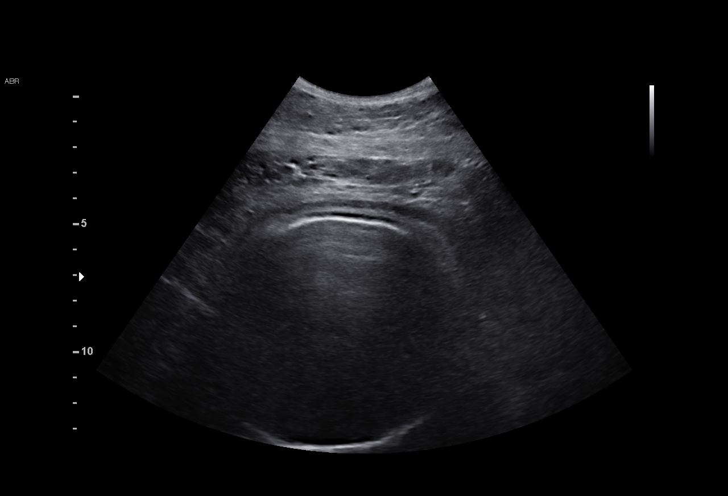

[15 of 28 positions shown; findings below may reference images not displayed]

[REDACTED]
                                                            1316

                                                      SEARS

Indications

 Obesity complicating pregnancy
 Pregnancy resulting from assisted
 reproductive technology
 Advanced maternal age multigravida 35+,
 second trimester
 History of cesarean delivery, currently
 pregnant
 35 weeks gestation of pregnancy
Fetal Evaluation

 Num Of Fetuses:         1
 Fetal Heart Rate(bpm):  145
 Cardiac Activity:       Observed
 Presentation:           Cephalic
 Placenta:               Anterior

 Amniotic Fluid
 AFI FV:      Within normal limits

 AFI Sum(cm)     %Tile       Largest Pocket(cm)
 18.1            67
 RUQ(cm)       RLQ(cm)       LUQ(cm)        LLQ(cm)

Biophysical Evaluation

 Amniotic F.V:   Within normal limits       F. Tone:        Observed
 F. Movement:    Observed                   Score:          [DATE]
 F. Breathing:   Observed
Biometry

 BPD:      89.2  mm     G. Age:  36w 1d         67  %    CI:        71.91   %    70 - 86
                                                         FL/HC:      20.1   %    20.1 -
 HC:      334.8  mm     G. Age:  38w 2d         80  %    HC/AC:      0.99        0.93 -
 AC:      338.7  mm     G. Age:  37w 5d         96  %    FL/BPD:     75.4   %    71 - 87
 FL:       67.3  mm     G. Age:  34w 4d         18  %    FL/AC:      19.9   %    20 - 24
 HUM:      59.3  mm     G. Age:  34w 2d         39  %

 Est. FW:    2874  gm    6 lb 12 oz      81  %
OB History

 Gravidity:    3         Term:   1
Gestational Age

 U/S Today:     36w 5d                                        EDD:   01/27/21
 Best:          35w 5d     Det. By:  Early Ultrasound         EDD:   02/03/21
                                     (06/22/20)
Comments

 This patient was seen for a follow up growth scan due to
 maternal obesity with a BMI of over 40 and an IVF
 pregnancy.  She denies any problems since her last exam.
 She was informed that the fetal growth and amniotic fluid
 level appears appropriate for her gestational age.
 A biophysical profile performed today was [DATE].
 As her BMI is greater than 40, she should continue weekly
 fetal testing with NSTs in your office until delivery.
 No further exams were scheduled in our office.

## 2023-02-05 ENCOUNTER — Inpatient Hospital Stay: Payer: BC Managed Care – PPO | Attending: Oncology | Admitting: Oncology

## 2023-02-05 ENCOUNTER — Encounter: Payer: Self-pay | Admitting: Oncology

## 2023-02-05 ENCOUNTER — Inpatient Hospital Stay: Payer: BC Managed Care – PPO

## 2023-02-05 VITALS — BP 139/85 | HR 107 | Temp 98.7°F | Resp 18 | Ht 68.0 in | Wt 270.2 lb

## 2023-02-05 DIAGNOSIS — D72820 Lymphocytosis (symptomatic): Secondary | ICD-10-CM | POA: Diagnosis present

## 2023-02-05 DIAGNOSIS — Z806 Family history of leukemia: Secondary | ICD-10-CM | POA: Diagnosis not present

## 2023-02-05 DIAGNOSIS — D72828 Other elevated white blood cell count: Secondary | ICD-10-CM

## 2023-02-05 DIAGNOSIS — Z79899 Other long term (current) drug therapy: Secondary | ICD-10-CM | POA: Diagnosis not present

## 2023-02-05 DIAGNOSIS — R5382 Chronic fatigue, unspecified: Secondary | ICD-10-CM | POA: Diagnosis not present

## 2023-02-05 DIAGNOSIS — Z809 Family history of malignant neoplasm, unspecified: Secondary | ICD-10-CM | POA: Insufficient documentation

## 2023-02-05 LAB — CBC WITH DIFFERENTIAL/PLATELET
Abs Immature Granulocytes: 0.03 10*3/uL (ref 0.00–0.07)
Basophils Absolute: 0.1 10*3/uL (ref 0.0–0.1)
Basophils Relative: 1 %
Eosinophils Absolute: 0.2 10*3/uL (ref 0.0–0.5)
Eosinophils Relative: 2 %
HCT: 36.9 % (ref 36.0–46.0)
Hemoglobin: 11.5 g/dL — ABNORMAL LOW (ref 12.0–15.0)
Immature Granulocytes: 0 %
Lymphocytes Relative: 30 %
Lymphs Abs: 3.2 10*3/uL (ref 0.7–4.0)
MCH: 24.7 pg — ABNORMAL LOW (ref 26.0–34.0)
MCHC: 31.2 g/dL (ref 30.0–36.0)
MCV: 79.4 fL — ABNORMAL LOW (ref 80.0–100.0)
Monocytes Absolute: 0.7 10*3/uL (ref 0.1–1.0)
Monocytes Relative: 7 %
Neutro Abs: 6.4 10*3/uL (ref 1.7–7.7)
Neutrophils Relative %: 60 %
Platelets: 413 10*3/uL — ABNORMAL HIGH (ref 150–400)
RBC: 4.65 MIL/uL (ref 3.87–5.11)
RDW: 14.3 % (ref 11.5–15.5)
WBC: 10.5 10*3/uL (ref 4.0–10.5)
nRBC: 0 % (ref 0.0–0.2)

## 2023-02-05 LAB — IRON AND TIBC
Iron: 35 ug/dL (ref 28–170)
Saturation Ratios: 7 % — ABNORMAL LOW (ref 10.4–31.8)
TIBC: 487 ug/dL — ABNORMAL HIGH (ref 250–450)
UIBC: 452 ug/dL

## 2023-02-05 LAB — VITAMIN B12: Vitamin B-12: 357 pg/mL (ref 180–914)

## 2023-02-05 LAB — FERRITIN: Ferritin: 4 ng/mL — ABNORMAL LOW (ref 11–307)

## 2023-02-05 LAB — FOLATE: Folate: 10.5 ng/mL (ref >5.9)

## 2023-02-05 NOTE — Progress Notes (Signed)
Hematology/Oncology Consult note [facility] [phone] [phone]  Patient Care Team: Marjie Skiff, NP as PCP - General (Nurse Practitioner)   Name of the patient: Mary Bradford  [id number]  18-Dec-1985    Reason for referral-neutrophilia and lymphocytosis   Referring physician-Jolene Patsi Sears, NP  Date of visit: 02/05/23   History of presenting illness-patient is a 37 year old female with a past medical history significant for PCOS referred for neutrophilia and lymphocytosis.  Patient has had mild leukocytosis with a white count ranging between 11-16 over the last 5 years predominantly with neutrophilia and lymphocytosis without any consistent rising trend  Hemoglobin and platelets have been normal.  She is a non-smoker.  She endorses chronic fatigue.  Denies any recent changes in her appetite or weight.  No recurrent infections.  ECOG PS- 0  Pain scale- 0   Review of systems- Review of Systems  Constitutional:  Positive for malaise/fatigue. Negative for chills, fever and weight loss.  HENT:  Negative for congestion, ear discharge and nosebleeds.   Eyes:  Negative for blurred vision.  Respiratory:  Negative for cough, hemoptysis, sputum production, shortness of breath and wheezing.   Cardiovascular:  Negative for chest pain, palpitations, orthopnea and claudication.  Gastrointestinal:  Negative for abdominal pain, blood in stool, constipation, diarrhea, heartburn, melena, nausea and vomiting.  Genitourinary:  Negative for dysuria, flank pain, frequency, hematuria and urgency.  Musculoskeletal:  Negative for back pain, joint pain and myalgias.  Skin:  Negative for rash.  Neurological:  Negative for dizziness, tingling, focal weakness, seizures, weakness and headaches.  Endo/Heme/Allergies:  Does not bruise/bleed easily.  Psychiatric/Behavioral:  Negative for depression and suicidal ideas. The patient does not have insomnia.      Allergies  Allergen Reactions   Amoxicillin Other (See Comments)    Has patient had a PCN reaction causing immediate rash, facial/tongue/throat swelling, SOB or lightheadedness with hypotension: Unknown Has patient had a PCN reaction causing severe rash involving mucus membranes or skin necrosis: Unknown Has patient had a PCN reaction that required hospitalization: Unknown Has patient had a PCN reaction occurring within the last 10 years: No If all of the above answers are "NO", then may proceed with Cephalosporin use.     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   History of depression 02/03/2021   Rh negative state in antepartum period 08/26/2020   Family history of AML (acute myeloid leukemia) 04/04/2019   Migraines 03/07/2019   BMI 40.0-44.9, adult (HCC) 03/07/2019   Obesity 03/07/2019   PCOS (polycystic ovarian syndrome) 08/04/2014     Past Medical History:  Diagnosis Date   Anxiety 2021   BMI 40.0-44.9, adult (HCC)    Depression    History of PCOS    Migraines    Miscarriage    2018   Newborn product of in vitro fertilization (IVF) pregnancy    PCOS (polycystic ovarian syndrome)      Past Surgical History:  Procedure Laterality Date   CESAREAN SECTION N/A 05/12/2017   Procedure: CESAREAN SECTION;  Surgeon: Osborn Coho, MD;  Location: [facility] BIRTHING SUITES;  Service: Obstetrics;  Laterality: N/A;  RNFA Requested OPERATIVE ULTRASOUND BEFORE CESAREAN TO CONFIRM PRESENTATION   egg  retrieval     WISDOM TOOTH EXTRACTION      Social History   Socioeconomic History   Marital status: Married    Spouse name: Alinda Money   Number of children: 1   Years of  education: Not on file   Highest education level: Bachelor's degree (e.g., BA, AB, BS)  Occupational History   Occupation: Runner, broadcasting/film/video    Comment: Therapist, sports  Tobacco Use   Smoking status: Never   Smokeless tobacco: Never  Vaping Use   Vaping status:  Never Used  Substance and Sexual Activity   Alcohol use: Not Currently    Alcohol/week: 1.0 standard drink of alcohol    Types: 1 Glasses of wine per week   Drug use: No   Sexual activity: Yes    Birth control/protection: None    Comment: History of infertility  Other Topics Concern   Not on file  Social History Narrative   Not on file   Social Determinants of Health   Financial Resource Strain: Medium Risk (08/06/2022)   Overall Financial Resource Strain (CARDIA)    Difficulty of Paying Living Expenses: Somewhat hard  Food Insecurity: No Food Insecurity (02/05/2023)   Hunger Vital Sign    Worried About Running Out of Food in the Last Year: Never true    Ran Out of Food in the Last Year: Never true  Transportation Needs: No Transportation Needs (02/05/2023)   PRAPARE - Administrator, Civil Service (Medical): No    Lack of Transportation (Non-Medical): No  Physical Activity: Unknown (08/06/2022)   Exercise Vital Sign    Days of Exercise per Week: Patient declined    Minutes of Exercise per Session: Not on file  Stress: Stress Concern Present (08/06/2022)   [employer] of Occupational Health - Occupational Stress Questionnaire    Feeling of Stress : Very much  Social Connections: Moderately Integrated (08/06/2022)   Social Connection and Isolation Panel [NHANES]    Frequency of Communication with Friends and Family: Once a week    Frequency of Social Gatherings with Friends and Family: Once a week    Attends Religious Services: More than 4 times per year    Active Member of [employer] or Organizations: Yes    Attends Engineer, structural: More than 4 times per year    Marital Status: Married  Catering manager Violence: Not At Risk (02/05/2023)   Humiliation, Afraid, Rape, and Kick questionnaire    Fear of Current or Ex-Partner: No    Emotionally Abused: No    Physically Abused: No    Sexually Abused: No     Family History  Problem Relation Age of  Onset   Kidney Stones Mother    Hypertension Mother    Hypertension Father    Kidney Stones Father    Migraines Father    Diabetes Father    Obesity Father    Irritable bowel syndrome Sister    Migraines Sister    Kidney disease Sister    Leukemia Sister    Cancer Sister    Early death Sister    Obesity Sister    Heart disease Maternal Grandmother    Diabetes Maternal Grandmother    Heart failure Maternal Grandmother    Diabetes Maternal Grandfather    Dementia Maternal Grandfather    Non-Hodgkin's lymphoma Paternal Grandfather    Leukemia Paternal Grandfather    Hodgkin's lymphoma Paternal Grandfather    Cancer Paternal Grandfather    Hearing loss Paternal Grandfather    Leukemia Maternal Uncle    Cancer Maternal Uncle    Early death Maternal Uncle      Current Outpatient Medications:    spironolactone (ALDACTONE) 50 MG tablet, Take 1 tablet (50 mg total) by  mouth daily., Disp: 90 tablet, Rfl: 4   cyclobenzaprine (FLEXERIL) 10 MG tablet, Take 1 tablet (10 mg total) by mouth 2 (two) times daily as needed for muscle spasms., Disp: 20 tablet, Rfl: 0   diazepam (VALIUM) 2 MG tablet, Take 1 tablet (2 mg total) by mouth every 8 (eight) hours as needed for muscle spasms., Disp: 15 tablet, Rfl: 0   HYDROcodone-acetaminophen (NORCO) 5-325 MG tablet, Take 1 tablet by mouth every 6 (six) hours as needed for moderate pain (pain score 4-6)., Disp: 15 tablet, Rfl: 0   lidocaine (LIDODERM) 5 %, Place 1 patch onto the skin daily. Remove & Discard patch within 12 hours or as directed by MD, Disp: 30 patch, Rfl: 0   naproxen (NAPROSYN) 500 MG tablet, Take 1 tablet (500 mg total) by mouth 2 (two) times daily with a meal., Disp: 30 tablet, Rfl: 0   Physical exam:  Vitals:   02/05/23 1414  BP: 139/85  Pulse: (!) 107  Resp: 18  Temp: 98.7 F (37.1 C)  TempSrc: Tympanic  SpO2: 100%  Weight: 270 lb 3.2 oz (122.6 kg)  Height: 5\' 8"  (1.727 m)   Physical Exam Cardiovascular:     Rate and  Rhythm: Normal rate and regular rhythm.     Heart sounds: Normal heart sounds.  Pulmonary:     Effort: Pulmonary effort is normal.     Breath sounds: Normal breath sounds.  Abdominal:     General: Bowel sounds are normal.     Palpations: Abdomen is soft.  Lymphadenopathy:     Comments: No palpable cervical, supraclavicular, axillary or inguinal adenopathy    Skin:    General: Skin is warm and dry.  Neurological:     Mental Status: She is alert and oriented to person, place, and time.           Latest Ref Rng & Units 01/01/2023    1:19 PM  CMP  Glucose 70 - 99 mg/dL 440   BUN 6 - 20 mg/dL 9   Creatinine 3.47 - 4.25 mg/dL 9.56   Sodium 387 - 564 mmol/L 138   Potassium 3.5 - 5.2 mmol/L 4.4   Chloride 96 - 106 mmol/L 102   CO2 20 - 29 mmol/L 22   Calcium 8.7 - 10.2 mg/dL 9.3   Total Protein 6.0 - 8.5 g/dL 7.0   Total Bilirubin 0.0 - 1.2 mg/dL 0.2   Alkaline Phos 44 - 121 IU/L 98   AST 0 - 40 IU/L 19   ALT 0 - 32 IU/L 18       Latest Ref Rng & Units 02/05/2023    3:03 PM  CBC  WBC 4.0 - 10.5 K/uL 10.5   Hemoglobin 12.0 - 15.0 g/dL 33.2   Hematocrit 95.1 - 46.0 % 36.9   Platelets 150 - 400 K/uL 413     No images are attached to the encounter.  DG Cervical Spine 2 or 3 views  Result Date: 01/18/2023 CLINICAL DATA:  Neck pain. EXAM: CERVICAL SPINE - 2-3 VIEW COMPARISON:  None Available. FINDINGS: Cervical spine alignment is maintained. Vertebral body heights and intervertebral disc spaces are preserved. The dens is intact. There is no evidence of fracture or focal bone abnormality. No prevertebral soft tissue edema. IMPRESSION: Negative radiographs of the cervical spine. Electronically Signed   By: Narda Rutherford M.D.   On: 01/18/2023 23:13    Assessment and plan- Patient is a 37 y.o. female referred for neutrophilia/lymphocytosis  Patient has had  mild neutrophilia/lymphocytosis dating back to at least 5 years.  Suspect this is reactive.  However I will check CBC  with differential and flow cytometry today.  Given her fatigue I am also checking ferritin and iron studies B12 and folate.  In person or video visit with me in 2 weeks   Thank you for this kind referral and the opportunity to participate in the care of this  Patient   Visit Diagnosis 1. Neutrophilia   2. Lymphocytosis     Dr. Owens Shark, MD, MPH [facility] at [facility] [phone] 02/05/2023

## 2023-02-08 LAB — COMP PANEL: LEUKEMIA/LYMPHOMA

## 2023-02-21 ENCOUNTER — Inpatient Hospital Stay: Payer: BC Managed Care – PPO | Admitting: Oncology

## 2023-02-26 ENCOUNTER — Telehealth: Payer: BC Managed Care – PPO | Admitting: Oncology

## 2023-04-10 ENCOUNTER — Encounter: Payer: Self-pay | Admitting: Oncology

## 2023-04-10 ENCOUNTER — Inpatient Hospital Stay: Payer: 59 | Attending: Oncology | Admitting: Oncology

## 2023-04-10 DIAGNOSIS — D509 Iron deficiency anemia, unspecified: Secondary | ICD-10-CM | POA: Diagnosis not present

## 2023-04-10 DIAGNOSIS — D72820 Lymphocytosis (symptomatic): Secondary | ICD-10-CM | POA: Diagnosis not present

## 2023-04-10 NOTE — Progress Notes (Signed)
 I connected with Mary Bradford on 04/10/23 at  3:15 PM EST by video enabled telemedicine visit and verified that I am speaking with the correct person using two identifiers.   I discussed the limitations, risks, security and privacy concerns of performing an evaluation and management service by telemedicine and the availability of in-person appointments. I also discussed with the patient that there may be a patient responsible charge related to this service. The patient expressed understanding and agreed to proceed.  Other persons participating in the visit and their role in the encounter:  none  Patient's location:  home Provider's location:  work  Stage Manager Complaint: Discuss results of blood work  History of present illness: patient is a 38 year old female with a past medical history significant for PCOS referred for neutrophilia and lymphocytosis. Patient has had mild leukocytosis with a white count ranging between 11-16 over the last 5 years predominantly with neutrophilia and lymphocytosis without any consistent rising trend Hemoglobin and platelets have been normal. She is a non-smoker. She endorses chronic fatigue. Denies any recent changes in her appetite or weight. No recurrent infections.  Denies any consistent use of NSAIDs.  Menstrual cycles are regular the first couple of days can be heavy  Results of blood work from 02/05/2023 were as follows: CBC showed white count of 10.5, H&H 11.5/36.9 with an MCV of 79.4 and platelet count of 413.  Differential was normal.  Ferritin levels were low at 4 with an iron  saturation of 7%.  B12 and folate were normal.  Flow cytometry did not show any evidence of immunophenotypic abnormality of monoclonal B-cell population.  Interval history she has mild baseline fatigue but denies other complaints at this time   Review of Systems  Constitutional:  Positive for malaise/fatigue. Negative for chills, fever and weight loss.  HENT:  Negative for  congestion, ear discharge and nosebleeds.   Eyes:  Negative for blurred vision.  Respiratory:  Negative for cough, hemoptysis, sputum production, shortness of breath and wheezing.   Cardiovascular:  Negative for chest pain, palpitations, orthopnea and claudication.  Gastrointestinal:  Negative for abdominal pain, blood in stool, constipation, diarrhea, heartburn, melena, nausea and vomiting.  Genitourinary:  Negative for dysuria, flank pain, frequency, hematuria and urgency.  Musculoskeletal:  Negative for back pain, joint pain and myalgias.  Skin:  Negative for rash.  Neurological:  Negative for dizziness, tingling, focal weakness, seizures, weakness and headaches.  Endo/Heme/Allergies:  Does not bruise/bleed easily.  Psychiatric/Behavioral:  Negative for depression and suicidal ideas. The patient does not have insomnia.     Allergies  Allergen Reactions   Amoxicillin Other (See Comments)    Has patient had a PCN reaction causing immediate rash, facial/tongue/throat swelling, SOB or lightheadedness with hypotension: Unknown Has patient had a PCN reaction causing severe rash involving mucus membranes or skin necrosis: Unknown Has patient had a PCN reaction that required hospitalization: Unknown Has patient had a PCN reaction occurring within the last 10 years: No If all of the above answers are NO, then may proceed with Cephalosporin use.     Past Medical History:  Diagnosis Date   Anxiety 2021   BMI 40.0-44.9, adult [facility])    Depression    History of PCOS    Migraines    Miscarriage    2018   Newborn product of in vitro fertilization (IVF) pregnancy    PCOS (polycystic ovarian syndrome)     Past Surgical History:  Procedure Laterality Date   CESAREAN SECTION N/A 05/12/2017  Procedure: CESAREAN SECTION;  Surgeon: Henry Slough, MD;  Location: [facility] BIRTHING SUITES;  Service: Obstetrics;  Laterality: N/A;  RNFA Requested OPERATIVE ULTRASOUND BEFORE CESAREAN TO CONFIRM  PRESENTATION   egg  retrieval     WISDOM TOOTH EXTRACTION      Social History   Socioeconomic History   Marital status: Married    Spouse name: Koren   Number of children: 1   Years of education: Not on file   Highest education level: Bachelor's degree (e.g., BA, AB, BS)  Occupational History   Occupation: runner, broadcasting/film/video    Comment: Therapist, Sports  Tobacco Use   Smoking status: Never   Smokeless tobacco: Never  Vaping Use   Vaping status: Never Used  Substance and Sexual Activity   Alcohol use: Not Currently    Alcohol/week: 1.0 standard drink of alcohol    Types: 1 Glasses of wine per week   Drug use: No   Sexual activity: Yes    Birth control/protection: None    Comment: History of infertility  Other Topics Concern   Not on file  Social History Narrative   Not on file   Social Drivers of Health   Financial Resource Strain: Medium Risk (08/06/2022)   Overall Financial Resource Strain (CARDIA)    Difficulty of Paying Living Expenses: Somewhat hard  Food Insecurity: No Food Insecurity (02/05/2023)   Hunger Vital Sign    Worried About Running Out of Food in the Last Year: Never true    Ran Out of Food in the Last Year: Never true  Transportation Needs: No Transportation Needs (02/05/2023)   PRAPARE - Administrator, Civil Service (Medical): No    Lack of Transportation (Non-Medical): No  Physical Activity: Unknown (08/06/2022)   Exercise Vital Sign    Days of Exercise per Week: Patient declined    Minutes of Exercise per Session: Not on file  Stress: Stress Concern Present (08/06/2022)   [employer] of Occupational Health - Occupational Stress Questionnaire    Feeling of Stress : Very much  Social Connections: Moderately Integrated (08/06/2022)   Social Connection and Isolation Panel [NHANES]    Frequency of Communication with Friends and Family: Once a week    Frequency of Social Gatherings with Friends and Family: Once a week    Attends Religious  Services: More than 4 times per year    Active Member of [employer] or Organizations: Yes    Attends Engineer, Structural: More than 4 times per year    Marital Status: Married  Catering Manager Violence: Not At Risk (02/05/2023)   Humiliation, Afraid, Rape, and Kick questionnaire    Fear of Current or Ex-Partner: No    Emotionally Abused: No    Physically Abused: No    Sexually Abused: No    Family History  Problem Relation Age of Onset   Kidney Stones Mother    Hypertension Mother    Hypertension Father    Kidney Stones Father    Migraines Father    Diabetes Father    Obesity Father    Irritable bowel syndrome Sister    Migraines Sister    Kidney disease Sister    Leukemia Sister    Cancer Sister    Early death Sister    Obesity Sister    Heart disease Maternal Grandmother    Diabetes Maternal Grandmother    Heart failure Maternal Grandmother    Diabetes Maternal Grandfather    Dementia Maternal Grandfather  Non-Hodgkin's lymphoma Paternal Grandfather    Leukemia Paternal Grandfather    Hodgkin's lymphoma Paternal Grandfather    Cancer Paternal Grandfather    Hearing loss Paternal Grandfather    Leukemia Maternal Uncle    Cancer Maternal Uncle    Early death Maternal Uncle      Current Outpatient Medications:    cyclobenzaprine  (FLEXERIL ) 10 MG tablet, Take 1 tablet (10 mg total) by mouth 2 (two) times daily as needed for muscle spasms., Disp: 20 tablet, Rfl: 0   diazepam  (VALIUM ) 2 MG tablet, Take 1 tablet (2 mg total) by mouth every 8 (eight) hours as needed for muscle spasms., Disp: 15 tablet, Rfl: 0   HYDROcodone -acetaminophen  (NORCO) 5-325 MG tablet, Take 1 tablet by mouth every 6 (six) hours as needed for moderate pain (pain score 4-6)., Disp: 15 tablet, Rfl: 0   lidocaine  (LIDODERM ) 5 %, Place 1 patch onto the skin daily. Remove & Discard patch within 12 hours or as directed by MD, Disp: 30 patch, Rfl: 0   naproxen  (NAPROSYN ) 500 MG tablet, Take 1  tablet (500 mg total) by mouth 2 (two) times daily with a meal., Disp: 30 tablet, Rfl: 0   spironolactone  (ALDACTONE ) 50 MG tablet, Take 1 tablet (50 mg total) by mouth daily., Disp: 90 tablet, Rfl: 4  No results found.  No images are attached to the encounter.      Latest Ref Rng & Units 01/01/2023    1:19 PM  CMP  Glucose 70 - 99 mg/dL 893   BUN 6 - 20 mg/dL 9   Creatinine 9.42 - 8.99 mg/dL 9.26   Sodium 865 - 855 mmol/L 138   Potassium 3.5 - 5.2 mmol/L 4.4   Chloride 96 - 106 mmol/L 102   CO2 20 - 29 mmol/L 22   Calcium 8.7 - 10.2 mg/dL 9.3   Total Protein 6.0 - 8.5 g/dL 7.0   Total Bilirubin 0.0 - 1.2 mg/dL 0.2   Alkaline Phos 44 - 121 IU/L 98   AST 0 - 40 IU/L 19   ALT 0 - 32 IU/L 18       Latest Ref Rng & Units 02/05/2023    3:03 PM  CBC  WBC 4.0 - 10.5 K/uL 10.5   Hemoglobin 12.0 - 15.0 g/dL 88.4   Hematocrit 63.9 - 46.0 % 36.9   Platelets 150 - 400 K/uL 413      Observation/objective:Appears in no acute distress over video visit today.  Breathing is nonlabored  Assessment and plan: Patient is a 38 year old female referred for leukocytosis/lymphocytosis  Leukocytosis lymphocytosis mild with a white cell count fluctuates between 11-13 peripheral flow cytometry was unremarkable and this does not require any further workup at this time  Patient has evidence of mild microcytic anemia with labs suggestive of iron  deficiency.  I discussed both oral versus IV iron  and she would like to proceed with oral iron  at this time  Follow-up instructions: CBC ferritin and iron  studies in 3 months 6 months and I will see her back in 6 months  I discussed the assessment and treatment plan with the patient. The patient was provided an opportunity to ask questions and all were answered. The patient agreed with the plan and demonstrated an understanding of the instructions.   The patient was advised to call back or seek an in-person evaluation if the symptoms worsen or if the  condition fails to improve as anticipated.  I provided 11 minutes of face-to-face video visit time during  this encounter, and > 50% was spent counseling as documented under my assessment & plan.  Visit Diagnosis: 1. Lymphocytosis   2. Iron  deficiency anemia, unspecified iron  deficiency anemia type     Dr. Annah Skene, MD, MPH [facility] at [facility] Tel- [phone] 04/10/2023 4:39 PM

## 2023-06-04 ENCOUNTER — Encounter: Payer: Self-pay | Admitting: Nurse Practitioner

## 2023-06-06 ENCOUNTER — Ambulatory Visit: Admitting: Nurse Practitioner

## 2023-07-06 ENCOUNTER — Other Ambulatory Visit: Payer: Self-pay

## 2023-07-06 DIAGNOSIS — D509 Iron deficiency anemia, unspecified: Secondary | ICD-10-CM

## 2023-07-09 ENCOUNTER — Inpatient Hospital Stay: Payer: 59 | Attending: Oncology

## 2023-07-09 DIAGNOSIS — D72820 Lymphocytosis (symptomatic): Secondary | ICD-10-CM | POA: Diagnosis present

## 2023-07-09 DIAGNOSIS — D509 Iron deficiency anemia, unspecified: Secondary | ICD-10-CM | POA: Diagnosis present

## 2023-07-09 LAB — CBC (CANCER CENTER ONLY)
HCT: 37.6 % (ref 36.0–46.0)
Hemoglobin: 12 g/dL (ref 12.0–15.0)
MCH: 26.4 pg (ref 26.0–34.0)
MCHC: 31.9 g/dL (ref 30.0–36.0)
MCV: 82.8 fL (ref 80.0–100.0)
Platelet Count: 343 10*3/uL (ref 150–400)
RBC: 4.54 MIL/uL (ref 3.87–5.11)
RDW: 14.9 % (ref 11.5–15.5)
WBC Count: 10.7 10*3/uL — ABNORMAL HIGH (ref 4.0–10.5)
nRBC: 0 % (ref 0.0–0.2)

## 2023-07-09 LAB — FERRITIN: Ferritin: 9 ng/mL — ABNORMAL LOW (ref 11–307)

## 2023-07-09 LAB — IRON AND TIBC
Iron: 79 ug/dL (ref 28–170)
Saturation Ratios: 19 % (ref 10.4–31.8)
TIBC: 407 ug/dL (ref 250–450)
UIBC: 328 ug/dL

## 2023-07-13 ENCOUNTER — Encounter: Payer: Self-pay | Admitting: Nurse Practitioner

## 2023-07-13 ENCOUNTER — Ambulatory Visit: Admitting: Nurse Practitioner

## 2023-07-13 VITALS — BP 124/85 | HR 86 | Temp 97.6°F | Ht 68.0 in | Wt 272.2 lb

## 2023-07-13 DIAGNOSIS — M79671 Pain in right foot: Secondary | ICD-10-CM | POA: Insufficient documentation

## 2023-07-13 MED ORDER — PREDNISONE 10 MG (21) PO TBPK: ORAL_TABLET | ORAL | 0 refills | Status: DC

## 2023-07-13 NOTE — Patient Instructions (Signed)
 Foot Pain Many things can cause foot pain. Common causes include injuries to the foot. The injuries include sprains or broken bones, or injuries that affect the nerves in the feet. Other causes of foot pain include arthritis, blisters, and bunions. To know what causes your foot pain, your health care provider will take a detailed history of your symptoms. They will also do a physical exam as well as imaging tests, such as X-ray or MRI. Follow these instructions at home: Managing pain, stiffness, and swelling  If told, put ice on the painful area. Put ice in a plastic bag. Place a towel between your skin and the bag. Leave the ice on for 20 minutes, 2-3 times a day. If your skin turns bright red, remove the ice right away to prevent skin damage. The risk of damage is higher if you cannot feel pain, heat, or cold. Activity Do not stand or walk for long periods. Do stretches to relieve foot pain and stiffness as told by your provider. Do not lift anything that is heavier than 10 lb (4.5 kg), or the limit that you are told, until your provider says that it is safe. Lifting a lot of weight can put added pressure on your feet. Return to your normal activities as told by your provider. Ask your provider what activities are safe for you. Lifestyle Wear comfortable, supportive shoes that fit you well. Do not wear high heels. Keep your feet clean and dry. General instructions Take over-the-counter and prescription medicines only as told by your provider. Rub your foot gently. Pay attention to any changes in your symptoms. Let your provider know if symptoms become worse. Keep all follow-up visits. Your provider will want to monitor your progress. Contact a health care provider if: Your pain does not get better after a few days of treatment at home. Your pain gets worse. You cannot stand on your foot. Your foot or toes are swollen. Your foot is numb or tingling. Get help right away if: Your foot  or toes turn white or blue. You have warmth and redness along your foot. This information is not intended to replace advice given to you by your health care provider. Make sure you discuss any questions you have with your health care provider. Document Revised: 04/06/2022 Document Reviewed: 12/13/2021 Elsevier Patient Education  2024 [employer].

## 2023-07-13 NOTE — Assessment & Plan Note (Signed)
 Acute for about 2 weeks.  Suspect some tendonitis present.  Will send in steroid taper to start + educated her on treatments she can use: frozen water bottle and rolling, lacrosse ball rolling, may apply Voltaren gel to area.  Recommend wearing compression sleeve to foot when up moving.  May continue Aleeve as needed.  If ongoing over next 1-2 weeks she is to notify PCP and will place referral to podiatry, has low arch and may need inserts to prevent further episodes.

## 2023-07-13 NOTE — Progress Notes (Signed)
 BP 124/85   Pulse 86   Temp 97.6 F (36.4 C) (Oral)   Ht 5\' 8"  (1.727 m)   Wt 272 lb 3.2 oz (123.5 kg)   SpO2 99%   BMI 41.39 kg/m    Subjective:    Patient ID: Mary Bradford, female    DOB: 13-Feb-1986, 38 y.o.   MRN: [id number]  HPI: Mary Bradford is a 38 y.o. female  Chief Complaint  Patient presents with   Foot Injury   FOOT PAIN Presents for right foot pain, started hurting almost 2 weeks ago.  No recent injuries.  She woke-up one morning and it was present. Duration: weeks Involved foot: right Mechanism of injury: unknown Location: arch of foot Onset: sudden  Severity: 8/10 at worst and 3/10 at best, none while sitting today Quality:  aching, cramping, and throbbing Frequency: intermittent Radiation: no Aggravating factors: movement Alleviating factors:  heating pad Status: worse Treatments attempted: heat pad, Aleeve  Relief with NSAIDs?:  mild Weakness with weight bearing or walking: a little bit Morning stiffness: a little bit Swelling: no Redness: no Bruising: no Paresthesias / decreased sensation: no  Fevers:no   Relevant past medical, surgical, family and social history reviewed and updated as indicated. Interim medical history since our last visit reviewed. Allergies and medications reviewed and updated.  Review of Systems  Constitutional:  Negative for activity change, appetite change, diaphoresis, fatigue and fever.  Respiratory:  Negative for cough, chest tightness and shortness of breath.   Cardiovascular:  Negative for chest pain, palpitations and leg swelling.  Musculoskeletal:  Positive for arthralgias.  Neurological: Negative.   Psychiatric/Behavioral: Negative.      Per HPI unless specifically indicated above     Objective:    BP 124/85   Pulse 86   Temp 97.6 F (36.4 C) (Oral)   Ht 5\' 8"  (1.727 m)   Wt 272 lb 3.2 oz (123.5 kg)   SpO2 99%   BMI 41.39 kg/m   Wt Readings from Last 3 Encounters:  07/13/23 272 lb  3.2 oz (123.5 kg)  02/05/23 270 lb 3.2 oz (122.6 kg)  01/17/23 274 lb 3.2 oz (124.4 kg)    Physical Exam Vitals and nursing note reviewed.  Constitutional:      General: She is awake. She is not in acute distress.    Appearance: She is well-developed and well-groomed. She is obese. She is not ill-appearing or toxic-appearing.  HENT:     Head: Normocephalic.     Right Ear: Hearing and external ear normal.     Left Ear: Hearing and external ear normal.  Eyes:     General: Lids are normal.        Right eye: No discharge.        Left eye: No discharge.     Conjunctiva/sclera: Conjunctivae normal.     Pupils: Pupils are equal, round, and reactive to light.  Neck:     Thyroid : No thyromegaly.     Vascular: No carotid bruit.  Cardiovascular:     Rate and Rhythm: Normal rate and regular rhythm.     Pulses:          Dorsalis pedis pulses are 2+ on the right side and 2+ on the left side.       Posterior tibial pulses are 2+ on the right side and 2+ on the left side.     Heart sounds: Normal heart sounds. No murmur heard.    No gallop.  Pulmonary:  Effort: Pulmonary effort is normal. No accessory muscle usage or respiratory distress.     Breath sounds: Normal breath sounds.  Abdominal:     General: Bowel sounds are normal. There is no distension.     Palpations: Abdomen is soft.     Tenderness: There is no abdominal tenderness.  Musculoskeletal:     Cervical back: Normal range of motion and neck supple.     Right lower leg: No edema.     Left lower leg: No edema.     Right foot: Normal range of motion (discomfort with flexion). No deformity or foot drop.     Left foot: Normal range of motion. No deformity or foot drop.       Feet:  Feet:     Right foot:     Skin integrity: Skin integrity normal.     Toenail Condition: Right toenails are normal.     Left foot:     Skin integrity: Skin integrity normal.     Toenail Condition: Left toenails are normal.  Lymphadenopathy:      Cervical: No cervical adenopathy.  Skin:    General: Skin is warm and dry.  Neurological:     Mental Status: She is alert and oriented to person, place, and time.     Deep Tendon Reflexes: Reflexes are normal and symmetric.     Reflex Scores:      Brachioradialis reflexes are 2+ on the right side and 2+ on the left side.      Patellar reflexes are 2+ on the right side and 2+ on the left side. Psychiatric:        Attention and Perception: Attention normal.        Mood and Affect: Mood normal.        Speech: Speech normal.        Behavior: Behavior normal. Behavior is cooperative.        Thought Content: Thought content normal.     Results for orders placed or performed in visit on 07/09/23  Iron  and TIBC   Collection Time: 07/09/23  3:36 PM  Result Value Ref Range   Iron  79 28 - 170 ug/dL   TIBC 161 096 - 045 ug/dL   Saturation Ratios 19 10.4 - 31.8 %   UIBC 328 ug/dL  CBC (Cancer Center Only)   Collection Time: 07/09/23  3:36 PM  Result Value Ref Range   WBC Count 10.7 (H) 4.0 - 10.5 K/uL   RBC 4.54 3.87 - 5.11 MIL/uL   Hemoglobin 12.0 12.0 - 15.0 g/dL   HCT 40.9 81.1 - 91.4 %   MCV 82.8 80.0 - 100.0 fL   MCH 26.4 26.0 - 34.0 pg   MCHC 31.9 30.0 - 36.0 g/dL   RDW 78.2 95.6 - 21.3 %   Platelet Count 343 150 - 400 K/uL   nRBC 0.0 0.0 - 0.2 %  Ferritin   Collection Time: 07/09/23  3:36 PM  Result Value Ref Range   Ferritin 9 (L) 11 - 307 ng/mL      Assessment & Plan:   Problem List Items Addressed This Visit       Other   Foot pain, right - Primary   Acute for about 2 weeks.  Suspect some tendonitis present.  Will send in steroid taper to start + educated her on treatments she can use: frozen water bottle and rolling, lacrosse ball rolling, may apply Voltaren gel to area.  Recommend wearing compression sleeve to foot  when up moving.  May continue Aleeve as needed.  If ongoing over next 1-2 weeks she is to notify PCP and will place referral to podiatry, has low arch  and may need inserts to prevent further episodes.        Follow up plan: Return if symptoms worsen or fail to improve.

## 2023-07-17 NOTE — Telephone Encounter (Signed)
 We cannot offer infed at [employer] [facility]. It has a higher risk of infusion reaction and I dont do it here. So only venofer  and ferlicit but venofer  would be a better choice as it is 200 mg per dose as compared to ferlicit which is only 125 mg

## 2023-07-18 ENCOUNTER — Other Ambulatory Visit: Payer: Self-pay | Admitting: Oncology

## 2023-07-18 DIAGNOSIS — D508 Other iron deficiency anemias: Secondary | ICD-10-CM

## 2023-07-18 DIAGNOSIS — D509 Iron deficiency anemia, unspecified: Secondary | ICD-10-CM | POA: Insufficient documentation

## 2023-07-19 ENCOUNTER — Inpatient Hospital Stay

## 2023-07-19 VITALS — BP 122/87 | HR 86 | Temp 98.4°F | Resp 16

## 2023-07-19 DIAGNOSIS — D509 Iron deficiency anemia, unspecified: Secondary | ICD-10-CM | POA: Diagnosis not present

## 2023-07-19 DIAGNOSIS — D508 Other iron deficiency anemias: Secondary | ICD-10-CM

## 2023-07-19 MED ORDER — IRON SUCROSE 20 MG/ML IV SOLN
200.0000 mg | INTRAVENOUS | Status: DC
  Administered 2023-07-19: 200 mg via INTRAVENOUS
  Filled 2023-07-19: qty 10

## 2023-07-19 MED ORDER — SODIUM CHLORIDE 0.9% FLUSH
10.0000 mL | Freq: Once | INTRAVENOUS | Status: AC | PRN
  Administered 2023-07-19: 10 mL
  Filled 2023-07-19: qty 10

## 2023-07-19 NOTE — Patient Instructions (Signed)

## 2023-07-23 ENCOUNTER — Encounter: Payer: Self-pay | Admitting: Nurse Practitioner

## 2023-07-23 DIAGNOSIS — M79671 Pain in right foot: Secondary | ICD-10-CM

## 2023-07-26 ENCOUNTER — Inpatient Hospital Stay: Attending: Oncology

## 2023-07-26 VITALS — BP 127/85 | HR 98 | Temp 96.3°F | Resp 16

## 2023-07-26 DIAGNOSIS — D509 Iron deficiency anemia, unspecified: Secondary | ICD-10-CM | POA: Insufficient documentation

## 2023-07-26 DIAGNOSIS — D508 Other iron deficiency anemias: Secondary | ICD-10-CM

## 2023-07-26 MED ORDER — IRON SUCROSE 20 MG/ML IV SOLN
200.0000 mg | INTRAVENOUS | Status: DC
  Administered 2023-07-26: 200 mg via INTRAVENOUS
  Filled 2023-07-26: qty 10

## 2023-07-26 NOTE — Patient Instructions (Signed)

## 2023-08-02 ENCOUNTER — Inpatient Hospital Stay

## 2023-08-02 VITALS — BP 128/93 | HR 96 | Temp 97.8°F | Resp 18

## 2023-08-02 DIAGNOSIS — D509 Iron deficiency anemia, unspecified: Secondary | ICD-10-CM | POA: Diagnosis not present

## 2023-08-02 DIAGNOSIS — D508 Other iron deficiency anemias: Secondary | ICD-10-CM

## 2023-08-02 MED ORDER — IRON SUCROSE 20 MG/ML IV SOLN
200.0000 mg | INTRAVENOUS | Status: DC
  Administered 2023-08-02: 200 mg via INTRAVENOUS

## 2023-08-02 NOTE — Patient Instructions (Signed)

## 2023-08-09 ENCOUNTER — Inpatient Hospital Stay

## 2023-08-09 VITALS — BP 124/91 | HR 98 | Temp 97.4°F | Resp 16

## 2023-08-09 DIAGNOSIS — D508 Other iron deficiency anemias: Secondary | ICD-10-CM

## 2023-08-09 DIAGNOSIS — D509 Iron deficiency anemia, unspecified: Secondary | ICD-10-CM | POA: Diagnosis not present

## 2023-08-09 MED ORDER — IRON SUCROSE 20 MG/ML IV SOLN
200.0000 mg | INTRAVENOUS | Status: DC
  Administered 2023-08-09: 200 mg via INTRAVENOUS

## 2023-08-09 NOTE — Patient Instructions (Signed)

## 2023-08-16 ENCOUNTER — Inpatient Hospital Stay

## 2023-08-16 VITALS — BP 118/96 | HR 96 | Temp 98.0°F | Resp 19

## 2023-08-16 DIAGNOSIS — D509 Iron deficiency anemia, unspecified: Secondary | ICD-10-CM | POA: Diagnosis not present

## 2023-08-16 DIAGNOSIS — D508 Other iron deficiency anemias: Secondary | ICD-10-CM

## 2023-08-16 MED ORDER — SODIUM CHLORIDE 0.9% FLUSH
10.0000 mL | Freq: Once | INTRAVENOUS | Status: AC | PRN
Start: 2023-08-16 — End: 2023-08-16
  Administered 2023-08-16: 10 mL
  Filled 2023-08-16: qty 10

## 2023-08-16 MED ORDER — IRON SUCROSE 20 MG/ML IV SOLN
200.0000 mg | INTRAVENOUS | Status: DC
  Administered 2023-08-16: 200 mg via INTRAVENOUS

## 2023-08-22 ENCOUNTER — Encounter: Payer: Self-pay | Admitting: Nurse Practitioner

## 2023-08-24 ENCOUNTER — Ambulatory Visit
Admission: EM | Admit: 2023-08-24 | Discharge: 2023-08-24 | Disposition: A | Discharge status: Home / Self Care | Attending: Emergency Medicine | Admitting: Emergency Medicine

## 2023-08-24 ENCOUNTER — Encounter: Payer: Self-pay | Admitting: Emergency Medicine

## 2023-08-24 DIAGNOSIS — N939 Abnormal uterine and vaginal bleeding, unspecified: Secondary | ICD-10-CM | POA: Insufficient documentation

## 2023-08-24 LAB — CBC WITH DIFFERENTIAL/PLATELET
Abs Immature Granulocytes: 0.03 10*3/uL (ref 0.00–0.07)
Basophils Absolute: 0 10*3/uL (ref 0.0–0.1)
Basophils Relative: 0 %
Eosinophils Absolute: 0.1 10*3/uL (ref 0.0–0.5)
Eosinophils Relative: 1 %
HCT: 42.5 % (ref 36.0–46.0)
Hemoglobin: 14.2 g/dL (ref 12.0–15.0)
Immature Granulocytes: 0 %
Lymphocytes Relative: 24 %
Lymphs Abs: 3.3 10*3/uL (ref 0.7–4.0)
MCH: 28.1 pg (ref 26.0–34.0)
MCHC: 33.4 g/dL (ref 30.0–36.0)
MCV: 84.2 fL (ref 80.0–100.0)
Monocytes Absolute: 1 10*3/uL (ref 0.1–1.0)
Monocytes Relative: 7 %
Neutro Abs: 9.5 10*3/uL — ABNORMAL HIGH (ref 1.7–7.7)
Neutrophils Relative %: 68 %
Platelets: 314 10*3/uL (ref 150–400)
RBC: 5.05 MIL/uL (ref 3.87–5.11)
RDW: 14.8 % (ref 11.5–15.5)
WBC: 14 10*3/uL — ABNORMAL HIGH (ref 4.0–10.5)
nRBC: 0 % (ref 0.0–0.2)

## 2023-08-24 LAB — PREGNANCY, URINE: Preg Test, Ur: NEGATIVE

## 2023-08-24 NOTE — Discharge Instructions (Addendum)
 Your blood work today shows normal hematocrit and hemoglobin and urine pregnancy test was negative.  Your menstrual cycle may be heavier than usual due to the fact that you were nearly a week late.  Schedule an appointment with your OB/GYN for follow-up and to discuss treatment options.  If you develop any dizziness or fainting I recommend you go to the emergency department for evaluation.  Also, if you are bleeding through more than 1 pad an hour and or passing heavy clots you need to go to the ER.

## 2023-08-24 NOTE — ED Provider Notes (Signed)
 [facility]    CSN: [id number] Arrival date & time: 08/24/23  1427      History   Chief Complaint Chief Complaint  Patient presents with   Vaginal Bleeding    HPI Mary Bradford is a 38 y.o. female.   HPI  38 year old female with past medical history significant for migraine headaches and PCOS presents for evaluation of heavy menstrual bleeding.  She also has a history of iron  deficiency anemia.  The patient reports that she should have started her menstrual cycle approximately 6 to 7 days ago but just started yesterday.  She has not had any syncope or dizziness.  She does complain of some nausea and feeling cold.  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   History of depression 02/03/2021   Rh negative state in antepartum period 08/26/2020   Family history of AML (acute myeloid leukemia) 04/04/2019   Migraines 03/07/2019   BMI 40.0-44.9, adult (HCC) 03/07/2019   Obesity 03/07/2019   PCOS (polycystic ovarian syndrome) 08/04/2014    Past Surgical History:  Procedure Laterality Date   CESAREAN SECTION N/A 05/12/2017   Procedure: CESAREAN SECTION;  Surgeon: Renea Carrion, MD;  Location: [facility] BIRTHING SUITES;  Service: Obstetrics;  Laterality: N/A;  RNFA Requested OPERATIVE ULTRASOUND BEFORE CESAREAN TO CONFIRM PRESENTATION   egg  retrieval     WISDOM TOOTH EXTRACTION      OB History     Gravida  3   Para  2   Term  2   Preterm      AB  1   Living  2      SAB  1   IAB      Ectopic      Multiple  0   Live Births  2            Home Medications    Prior  to Admission medications   Not on File    Family History Family History  Problem Relation Age of Onset   Kidney Stones Mother    Hypertension Mother    Hypertension Father    Kidney Stones Father    Migraines Father    Diabetes Father    Obesity Father    Irritable bowel syndrome Sister    Migraines Sister    Kidney disease Sister    Leukemia Sister    Cancer Sister    Early death Sister    Obesity Sister    Heart disease Maternal Grandmother    Diabetes Maternal Grandmother    Heart failure Maternal Grandmother    Diabetes Maternal Grandfather    Dementia Maternal Grandfather    Non-Hodgkin's lymphoma Paternal Grandfather    Leukemia Paternal Grandfather    Hodgkin's lymphoma Paternal Grandfather    Cancer Paternal Grandfather    Hearing loss Paternal Grandfather    Leukemia Maternal Uncle    Cancer Maternal Uncle    Early death Maternal Uncle     Social History Social History   Tobacco Use   Smoking status: Never  Smokeless tobacco: Never  Vaping Use   Vaping status: Never Used  Substance Use Topics   Alcohol use: Not Currently    Alcohol/week: 1.0 standard drink of alcohol    Types: 1 Glasses of wine per week   Drug use: No     Allergies   Amoxicillin   Review of Systems Review of Systems  Constitutional:        Feeling cold  Gastrointestinal:  Positive for nausea.  Genitourinary:  Positive for vaginal bleeding.  Neurological:  Negative for dizziness and syncope.     Physical Exam Triage Vital Signs ED Triage Vitals  Encounter Vitals Group     BP      Systolic BP Percentile      Diastolic BP Percentile      Pulse      Resp      Temp      Temp src      SpO2      Weight      Height      Head Circumference      Peak Flow      Pain Score      Pain Loc      Pain Education      Exclude from Growth Chart    No data found.  Updated Vital Signs BP (!) 153/105 (BP Location: Left Arm)   Pulse (!) 127   Temp 98 F (36.7 C) (Oral)    Resp 18   LMP 08/23/2023 (Exact Date)   SpO2 98%   Visual Acuity Right Eye Distance:   Left Eye Distance:   Bilateral Distance:    Right Eye Near:   Left Eye Near:    Bilateral Near:     Physical Exam Vitals and nursing note reviewed.  Constitutional:      Appearance: Normal appearance.  HENT:     Mouth/Throat:     Mouth: Mucous membranes are moist.     Pharynx: Oropharynx is clear. No oropharyngeal exudate or posterior oropharyngeal erythema.  Eyes:     Extraocular Movements: Extraocular movements intact.     Conjunctiva/sclera: Conjunctivae normal.     Pupils: Pupils are equal, round, and reactive to light.  Abdominal:     Palpations: Abdomen is soft.     Tenderness: There is no abdominal tenderness.  Skin:    General: Skin is warm and dry.     Capillary Refill: Capillary refill takes less than 2 seconds.     Coloration: Skin is pale.  Neurological:     General: No focal deficit present.     Mental Status: She is alert and oriented to person, place, and time.      UC Treatments / Results  Labs (all labs ordered are listed, but only abnormal results are displayed) Labs Reviewed  CBC WITH DIFFERENTIAL/PLATELET - Abnormal; Notable for the following components:      Result Value   WBC 14.0 (*)    Neutro Abs 9.5 (*)    All other components within normal limits  PREGNANCY, URINE    EKG   Radiology No results found.  Procedures Procedures (including critical care time)  Medications Ordered in UC Medications - No data to display  Initial Impression / Assessment and Plan / UC Course  I have reviewed the triage vital signs and the nursing notes.  Pertinent labs & imaging results that were available during my care of the patient were reviewed by me and considered in my medical decision making (see  chart for details).   Patient is a nontoxic-appearing 38 year old female presenting for evaluation of heavy menstrual bleeding.  She has a history of PCOS though  reports that her menstrual cycle is typically irregular.  Review of epic shows that she messaged her PCP 2 days ago to indicate that she was late on her menstrual cycle.  She was advised to continue to monitor for the arrival of her menstrual cycle which started yesterday.  She has not had any syncope and denies feeling dizzy.  She does have a history of iron  deficiency anemia and had an iron  infusion 8 days ago.  She states that because she is bleeding more than normal and she is concerned that her hemoglobin might be low.  She is mildly pale but her oral mucous membranes and conjunctiva are normal in appearance.  I will order a CBC to assess the patient's H&H as well as a urine pregnancy test.  The patient did take a home pregnancy test several days ago and it was negative.  CBC shows a mildly elevated white count of 14 but H&H is normal at 14.2 and 42.5 respectively.  Platelets are normal at 314.  Urine pregnancy test is negative.  Will discharge patient home with a diagnosis of heavy vaginal bleeding and have her follow-up with her OB/GYN.   Final Clinical Impressions(s) / UC Diagnoses   Final diagnoses:  Episode of heavy vaginal bleeding     Discharge Instructions      Your blood work today shows normal hematocrit and hemoglobin and urine pregnancy test was negative.  Your menstrual cycle may be heavier than usual due to the fact that you were nearly a week late.  Schedule an appointment with your OB/GYN for follow-up and to discuss treatment options.  If you develop any dizziness or fainting I recommend you go to the emergency department for evaluation.  Also, if you are bleeding through more than 1 pad an hour and or passing heavy clots you need to go to the ER.   ED Prescriptions   None    PDMP not reviewed this encounter.   Kent Pear, NP 08/24/23 [phone]

## 2023-08-24 NOTE — ED Triage Notes (Signed)
 Patient states she started her menses yesterday and has more bleeding than usual.

## 2023-08-30 ENCOUNTER — Encounter: Payer: Self-pay | Admitting: Oncology

## 2023-09-17 ENCOUNTER — Other Ambulatory Visit: Payer: Self-pay

## 2023-09-17 DIAGNOSIS — D509 Iron deficiency anemia, unspecified: Secondary | ICD-10-CM

## 2023-09-17 DIAGNOSIS — D508 Other iron deficiency anemias: Secondary | ICD-10-CM

## 2023-09-18 ENCOUNTER — Encounter: Payer: Self-pay | Admitting: Oncology

## 2023-09-18 ENCOUNTER — Inpatient Hospital Stay: Attending: Oncology

## 2023-09-18 ENCOUNTER — Inpatient Hospital Stay (HOSPITAL_BASED_OUTPATIENT_CLINIC_OR_DEPARTMENT_OTHER): Admitting: Oncology

## 2023-09-18 VITALS — BP 139/86 | HR 100 | Temp 97.6°F | Resp 18 | Ht 68.0 in | Wt 274.0 lb

## 2023-09-18 DIAGNOSIS — D509 Iron deficiency anemia, unspecified: Secondary | ICD-10-CM | POA: Insufficient documentation

## 2023-09-18 DIAGNOSIS — D508 Other iron deficiency anemias: Secondary | ICD-10-CM

## 2023-09-18 LAB — CBC WITH DIFFERENTIAL (CANCER CENTER ONLY)
Abs Immature Granulocytes: 0.03 10*3/uL (ref 0.00–0.07)
Basophils Absolute: 0 10*3/uL (ref 0.0–0.1)
Basophils Relative: 0 %
Eosinophils Absolute: 0.2 10*3/uL (ref 0.0–0.5)
Eosinophils Relative: 2 %
HCT: 39 % (ref 36.0–46.0)
Hemoglobin: 13.2 g/dL (ref 12.0–15.0)
Immature Granulocytes: 0 %
Lymphocytes Relative: 32 %
Lymphs Abs: 3.1 10*3/uL (ref 0.7–4.0)
MCH: 29.1 pg (ref 26.0–34.0)
MCHC: 33.8 g/dL (ref 30.0–36.0)
MCV: 86.1 fL (ref 80.0–100.0)
Monocytes Absolute: 0.6 10*3/uL (ref 0.1–1.0)
Monocytes Relative: 7 %
Neutro Abs: 5.5 10*3/uL (ref 1.7–7.7)
Neutrophils Relative %: 59 %
Platelet Count: 301 10*3/uL (ref 150–400)
RBC: 4.53 MIL/uL (ref 3.87–5.11)
RDW: 13.8 % (ref 11.5–15.5)
WBC Count: 9.4 10*3/uL (ref 4.0–10.5)
nRBC: 0 % (ref 0.0–0.2)

## 2023-09-18 LAB — IRON AND TIBC
Iron: 84 ug/dL (ref 28–170)
Saturation Ratios: 25 % (ref 10.4–31.8)
TIBC: 332 ug/dL (ref 250–450)
UIBC: 248 ug/dL

## 2023-09-18 LAB — FERRITIN: Ferritin: 88 ng/mL (ref 11–307)

## 2023-09-18 NOTE — Progress Notes (Signed)
 Patient had the five iron  infusions and said that they made her feel so much better. She isn't taking the iron  supplements due to not being able to tolerate the side effects.

## 2023-09-22 ENCOUNTER — Encounter: Payer: Self-pay | Admitting: Oncology

## 2023-09-22 NOTE — Progress Notes (Signed)
 Hematology/Oncology Consult note [facility]  Telephone:([phone] [phone]  Patient Care Team: Valerio Melanie DASEN, NP as PCP - General (Nurse Practitioner) Melanee Annah BROCKS, MD as Consulting Physician (Oncology)   Name of the patient: Mary Bradford  [id number]  [birth year]   Date of visit: 09/22/23  Diagnosis- 1. 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 Iron  deficiency anemia  Chief complaint/ Reason for visit- routine f/u of iron  deficiency anemia  Heme/Onc history: patient is a 38 year old female with a past medical history significant for PCOS referred for neutrophilia and lymphocytosis. Patient has had mild leukocytosis with a white count ranging between 11-16 over the last 5 years predominantly with neutrophilia and lymphocytosis without any consistent rising trend Hemoglobin and platelets have been normal. She is a non-smoker. She endorses chronic fatigue. Denies any recent changes in her appetite or weight. No recurrent infections.  Denies any consistent use of NSAIDs.  Menstrual cycles are regular the first couple of days can be heavy   Results of blood work from 02/05/2023 were as follows: CBC showed white count of 10.5, H&H 11.5/36.9 with an MCV of 79.4 and platelet count of 413.  Differential was normal.  Ferritin levels were low at 4 with an iron  saturation of 7%.  B12 and folate were normal.  Flow cytometry did not show any evidence of immunophenotypic abnormality of monoclonal B-cell population.  Patient received IV iron  in April 2025  Interval history- Patient reports improvement in her energy levels after receiving IV iron   ECOG PS- 1 Pain scale- 0  Review of systems- Review of Systems  Constitutional:  Negative for chills, fever, malaise/fatigue and weight loss.  HENT:  Negative for congestion, ear discharge and nosebleeds.   Eyes:  Negative for blurred vision.  Respiratory:  Negative for cough, hemoptysis, sputum production,  shortness of breath and wheezing.   Cardiovascular:  Negative for chest pain, palpitations, orthopnea and claudication.  Gastrointestinal:  Negative for abdominal pain, blood in stool, constipation, diarrhea, heartburn, melena, nausea and vomiting.  Genitourinary:  Negative for dysuria, flank pain, frequency, hematuria and urgency.  Musculoskeletal:  Negative for back pain, joint pain and myalgias.  Skin:  Negative for rash.  Neurological:  Negative for dizziness, tingling, focal weakness, seizures, weakness and headaches.  Endo/Heme/Allergies:  Does not bruise/bleed easily.  Psychiatric/Behavioral:  Negative for depression and suicidal ideas. The patient does not have insomnia.       Allergies  Allergen Reactions   Amoxicillin Other (See Comments)    Has patient had a PCN reaction causing immediate rash, facial/tongue/throat swelling, SOB or lightheadedness with hypotension: Unknown Has patient had a PCN reaction causing severe rash involving mucus membranes or skin necrosis: Unknown Has patient had a PCN reaction that required hospitalization: Unknown Has patient had a PCN reaction occurring within the last 10 years: No If all of the above answers are NO, then may proceed with Cephalosporin use.      Past Medical History:  Diagnosis Date   Anxiety 2021   BMI 40.0-44.9, adult [facility])    Depression    History of PCOS    Migraines    Miscarriage    2018   Newborn product of in vitro fertilization (IVF) pregnancy    PCOS (polycystic ovarian syndrome)      Past Surgical History:  Procedure Laterality Date   CESAREAN SECTION N/A 05/12/2017   Procedure: CESAREAN SECTION;  Surgeon: Henry Slough, MD;  Location: [facility] BIRTHING SUITES;  Service: Obstetrics;  Laterality: N/A;  RNFA Requested OPERATIVE ULTRASOUND BEFORE CESAREAN TO CONFIRM PRESENTATION   egg  retrieval     WISDOM TOOTH EXTRACTION      Social History   Socioeconomic History   Marital status: Married    Spouse  name: Koren   Number of children: 1   Years of education: Not on file   Highest education level: Bachelor's degree (e.g., BA, AB, BS)  Occupational History   Occupation: Runner, broadcasting/film/video    Comment: Therapist, sports  Tobacco Use   Smoking status: Never   Smokeless tobacco: Never  Vaping Use   Vaping status: Never Used  Substance and Sexual Activity   Alcohol use: Not Currently    Alcohol/week: 1.0 standard drink of alcohol    Types: 1 Glasses of wine per week   Drug use: No   Sexual activity: Yes    Birth control/protection: None    Comment: History of infertility  Other Topics Concern   Not on file  Social History Narrative   Not on file   Social Drivers of Health   Financial Resource Strain: Medium Risk (08/06/2022)   Overall Financial Resource Strain (CARDIA)    Difficulty of Paying Living Expenses: Somewhat hard  Food Insecurity: No Food Insecurity (02/05/2023)   Hunger Vital Sign    Worried About Running Out of Food in the Last Year: Never true    Ran Out of Food in the Last Year: Never true  Transportation Needs: No Transportation Needs (02/05/2023)   PRAPARE - Administrator, Civil Service (Medical): No    Lack of Transportation (Non-Medical): No  Physical Activity: Unknown (08/06/2022)   Exercise Vital Sign    Days of Exercise per Week: Patient declined    Minutes of Exercise per Session: Not on file  Stress: Stress Concern Present (08/06/2022)   [employer] of Occupational Health - Occupational Stress Questionnaire    Feeling of Stress : Very much  Social Connections: Moderately Integrated (08/06/2022)   Social Connection and Isolation Panel    Frequency of Communication with Friends and Family: Once a week    Frequency of Social Gatherings with Friends and Family: Once a week    Attends Religious Services: More than 4 times per year    Active Member of [employer] or Organizations: Yes    Attends Engineer, structural: More than 4 times per year     Marital Status: Married  Catering manager Violence: Not At Risk (02/05/2023)   Humiliation, Afraid, Rape, and Kick questionnaire    Fear of Current or Ex-Partner: No    Emotionally Abused: No    Physically Abused: No    Sexually Abused: No    Family History  Problem Relation Age of Onset   Kidney Stones Mother    Hypertension Mother    Hypertension Father    Kidney Stones Father    Migraines Father    Diabetes Father    Obesity Father    Irritable bowel syndrome Sister    Migraines Sister    Kidney disease Sister    Leukemia Sister    Cancer Sister    Early death Sister    Obesity Sister    Heart disease Maternal Grandmother    Diabetes Maternal Grandmother    Heart failure Maternal Grandmother    Diabetes Maternal Grandfather    Dementia Maternal Grandfather    Non-Hodgkin's lymphoma Paternal Grandfather    Leukemia Paternal Grandfather    Hodgkin's lymphoma Paternal Grandfather    Cancer  Paternal Grandfather    Hearing loss Paternal Grandfather    Leukemia Maternal Uncle    Cancer Maternal Uncle    Early death Maternal Uncle     No current outpatient medications on file.  Physical exam:  Vitals:   09/18/23 1508  BP: 139/86  Pulse: 100  Resp: 18  Temp: 97.6 F (36.4 C)  TempSrc: Tympanic  SpO2: 100%  Weight: 274 lb (124.3 kg)  Height: 5' 8 (1.727 m)   Physical Exam  Cardiovascular:     Rate and Rhythm: Normal rate and regular rhythm.     Heart sounds: Normal heart sounds.  Pulmonary:     Effort: Pulmonary effort is normal.     Breath sounds: Normal breath sounds.  Abdominal:     General: Bowel sounds are normal.   Skin:    General: Skin is warm and dry.   Neurological:     Mental Status: She is alert and oriented to person, place, and time.      I have personally reviewed labs listed below:  CBC    Component Value Date/Time   WBC 9.4 09/18/2023 1455   WBC 14.0 (H) 08/24/2023 1449   RBC 4.53 09/18/2023 1455   HGB 13.2 09/18/2023  1455   HGB 11.9 01/29/2023 1456   HCT 39.0 09/18/2023 1455   HCT 37.4 01/29/2023 1456   PLT 301 09/18/2023 1455   PLT 440 01/29/2023 1456   MCV 86.1 09/18/2023 1455   MCV 79 01/29/2023 1456   MCH 29.1 09/18/2023 1455   MCHC 33.8 09/18/2023 1455   RDW 13.8 09/18/2023 1455   RDW 14.1 01/29/2023 1456   LYMPHSABS 3.1 09/18/2023 1455   LYMPHSABS 3.6 (H) 01/29/2023 1456   MONOABS 0.6 09/18/2023 1455   EOSABS 0.2 09/18/2023 1455   EOSABS 0.1 01/29/2023 1456   BASOSABS 0.0 09/18/2023 1455   BASOSABS 0.0 01/29/2023 1456    Assessment and plan- Patient is a 38 y.o. female here for routine follow-up of iron  deficiency anemia  Patient's white cell count is presently normal at 9.4.  Hemoglobin is normal at 13.2/39.  Iron  studies are presently within normal limits with a ferritin level of 88 improved as compared to 9 before and normal iron  studies.  Patient does not require any IV iron  at this time.  Repeat CBC ferritin and iron  studies and B12 levels in 4 and 8 months and I will see her back in 8 months.   Visit Diagnosis 1. Iron  deficiency anemia, unspecified iron  deficiency anemia type      Dr. Annah Skene, MD, MPH [facility] [phone] 09/22/2023 4:54 PM

## 2023-10-08 ENCOUNTER — Other Ambulatory Visit: Payer: 59

## 2023-10-08 ENCOUNTER — Ambulatory Visit: Payer: 59 | Admitting: Oncology

## 2023-10-20 ENCOUNTER — Telehealth

## 2023-10-20 DIAGNOSIS — R21 Rash and other nonspecific skin eruption: Secondary | ICD-10-CM

## 2023-10-21 MED ORDER — TRIAMCINOLONE ACETONIDE 0.1 % EX OINT: TOPICAL_OINTMENT | Freq: Two times a day (BID) | CUTANEOUS | 0 refills | Status: DC

## 2023-10-21 NOTE — Progress Notes (Signed)
 E Visit for Rash  We are sorry that you are not feeling well. Here is how we plan to help!  I am prescribing triamcinolone  0.1 % cream -- apply to the affected area(s) in a thin layer, twice daily for up to 14 days. Do not apply to face, privates or armpit regions.    HOME CARE:  Take cool showers and avoid direct sunlight. Apply cool compress or wet dressings. Take a bath in an oatmeal bath.  Sprinkle content of one Aveeno packet under running faucet with comfortably warm water.  Bathe for 15-20 minutes, 1-2 times daily.  Pat dry with a towel. Do not rub the rash. Use hydrocortisone  cream. Take an antihistamine like Benadryl for widespread rashes that itch.  The adult dose of Benadryl is 25-50 mg by mouth 4 times daily. Caution:  This type of medication may cause sleepiness.  Do not drink alcohol, drive, or operate dangerous machinery while taking antihistamines.  Do not take these medications if you have prostate enlargement.  Read package instructions thoroughly on all medications that you take.  GET HELP RIGHT AWAY IF:  Symptoms don't go away after treatment. Severe itching that persists. If you rash spreads or swells. If you rash begins to smell. If it blisters and opens or develops a yellow-brown crust. You develop a fever. You have a sore throat. You become short of breath.  MAKE SURE YOU:  Understand these instructions. Will watch your condition. Will get help right away if you are not doing well or get worse.  Thank you for choosing an e-visit.  Your e-visit answers were reviewed by a board certified advanced clinical practitioner to complete your personal care plan. Depending upon the condition, your plan could have included both over the counter or prescription medications.  Please review your pharmacy choice. Make sure the pharmacy is open so you can pick up prescription now. If there is a problem, you may contact your provider through [employer] and have the  prescription routed to another pharmacy.  Your safety is important to us . If you have drug allergies check your prescription carefully.   For the next 24 hours you can use MyChart to ask questions about today's visit, request a non-urgent call back, or ask for a work or school excuse. You will get an email in the next two days asking about your experience. I hope that your e-visit has been valuable and will speed your recovery.

## 2023-10-21 NOTE — Progress Notes (Signed)
 I have spent 5 minutes in review of e-visit questionnaire, review and updating patient chart, medical decision making and response to patient.   Laure Kidney, PA-C

## 2023-10-23 MED ORDER — TRIAMCINOLONE ACETONIDE 0.1 % EX OINT: TOPICAL_OINTMENT | Freq: Two times a day (BID) | CUTANEOUS | 0 refills | Status: AC

## 2023-10-23 NOTE — Addendum Note (Signed)
 Addended by: GLADIS ELSIE BROCKS on: 10/23/2023 04:14 PM   Modules accepted: Orders

## 2023-10-24 MED ORDER — TRIAMCINOLONE ACETONIDE 0.1 % EX CREA: TOPICAL_CREAM | Freq: Two times a day (BID) | CUTANEOUS | 0 refills | Status: DC | PRN

## 2023-10-24 NOTE — Addendum Note (Signed)
 Addended by: GLADIS ELSIE BROCKS on: 10/24/2023 05:40 PM   Modules accepted: Orders

## 2023-11-14 ENCOUNTER — Ambulatory Visit: Admitting: Certified Nurse Midwife

## 2023-11-19 NOTE — Progress Notes (Unsigned)
    GYNECOLOGY PROGRESS NOTE  Subjective:    Patient ID: Mary Bradford, female    DOB: 04/28/1985, 38 y.o.   MRN: [id number]  HPI  Patient is a 38 y.o. [id number] female who presents for evaluation for irregular cycles.  {Common ambulatory SmartLinks:19316}  Review of Systems {ros; complete:30496}   Objective:   There were no vitals taken for this visit. There is no height or weight on file to calculate BMI. General appearance: {general exam:16600} Abdomen: {abdominal exam:16834} Pelvic: {pelvic exam:16852::cervix normal in appearance,external genitalia normal,no adnexal masses or tenderness,no cervical motion tenderness,rectovaginal septum normal,uterus normal size, shape, and consistency,vagina normal without discharge} Extremities: {extremity exam:5109} Neurologic: {neuro exam:17854}   Assessment:   No diagnosis found.   Plan:   There are no diagnoses linked to this encounter.    Damien Parsley, CNM [facility]

## 2023-11-20 ENCOUNTER — Ambulatory Visit: Admitting: Certified Nurse Midwife

## 2023-11-20 ENCOUNTER — Telehealth

## 2023-11-20 VITALS — BP 141/97 | HR 117 | Resp 16 | Ht 68.0 in | Wt 274.6 lb

## 2023-11-20 DIAGNOSIS — N926 Irregular menstruation, unspecified: Secondary | ICD-10-CM

## 2023-11-20 DIAGNOSIS — B958 Unspecified staphylococcus as the cause of diseases classified elsewhere: Secondary | ICD-10-CM | POA: Diagnosis not present

## 2023-11-20 DIAGNOSIS — L089 Local infection of the skin and subcutaneous tissue, unspecified: Secondary | ICD-10-CM | POA: Diagnosis not present

## 2023-11-21 ENCOUNTER — Other Ambulatory Visit

## 2023-11-21 MED ORDER — CEFDINIR 300 MG PO CAPS: 300.0000 mg | ORAL_CAPSULE | Freq: Two times a day (BID) | ORAL | 0 refills | Status: DC

## 2023-11-21 NOTE — Progress Notes (Signed)
 E Visit for Rash  We are sorry that you are not feeling well. Here is how we plan to help!  Based on your exposure and current symptoms, I would agree there is concern for a staph skin infection. I would recommend keeping the skin clean and dry. Avoid picking or scratching at the areas as this can spread infection. I have prescribed Cefdinir  to take twice daily for 7 days, giving what you have told me regarding treatment for your children.    HOME CARE:  Take cool showers and avoid direct sunlight. Apply cool compress or wet dressings. Take an antihistamine like Benadryl  for widespread rashes that itch.  The adult dose of Benadryl  is 25-50 mg by mouth 4 times daily. Caution:  This type of medication may cause sleepiness.  Do not drink alcohol, drive, or operate dangerous machinery while taking antihistamines.  Do not take these medications if you have prostate enlargement.  Read package instructions thoroughly on all medications that you take.  GET HELP RIGHT AWAY IF:  Symptoms don't go away after treatment. Severe itching that persists. If you rash spreads or swells. If you rash begins to smell. If it blisters and opens or develops a yellow-brown crust. You develop a fever. You have a sore throat. You become short of breath.  MAKE SURE YOU:  Understand these instructions. Will watch your condition. Will get help right away if you are not doing well or get worse.  Thank you for choosing an e-visit.  Your e-visit answers were reviewed by a board certified advanced clinical practitioner to complete your personal care plan. Depending upon the condition, your plan could have included both over the counter or prescription medications.  Please review your pharmacy choice. Make sure the pharmacy is open so you can pick up prescription now. If there is a problem, you may contact your provider through [employer] and have the prescription routed to another pharmacy.  Your safety is  important to us . If you have drug allergies check your prescription carefully.   For the next 24 hours you can use MyChart to ask questions about today's visit, request a non-urgent call back, or ask for a work or school excuse. You will get an email in the next two days asking about your experience. I hope that your e-visit has been valuable and will speed your recovery.

## 2023-11-21 NOTE — Progress Notes (Signed)
 I have spent 5 minutes in review of e-visit questionnaire, review and updating patient chart, medical decision making and response to patient.   Elsie Velma Lunger, PA-C

## 2023-11-23 ENCOUNTER — Emergency Department
Admission: EM | Admit: 2023-11-23 | Discharge: 2023-11-23 | Disposition: A | Discharge status: Home / Self Care | Attending: Emergency Medicine | Admitting: Emergency Medicine

## 2023-11-23 ENCOUNTER — Other Ambulatory Visit: Payer: Self-pay

## 2023-11-23 DIAGNOSIS — L02415 Cutaneous abscess of right lower limb: Secondary | ICD-10-CM | POA: Insufficient documentation

## 2023-11-23 DIAGNOSIS — L03115 Cellulitis of right lower limb: Secondary | ICD-10-CM | POA: Insufficient documentation

## 2023-11-23 DIAGNOSIS — Z23 Encounter for immunization: Secondary | ICD-10-CM | POA: Diagnosis not present

## 2023-11-23 LAB — CBC WITH DIFFERENTIAL/PLATELET
Abs Immature Granulocytes: 0.03 K/uL (ref 0.00–0.07)
Basophils Absolute: 0.1 K/uL (ref 0.0–0.1)
Basophils Relative: 0 %
Eosinophils Absolute: 0.3 K/uL (ref 0.0–0.5)
Eosinophils Relative: 3 %
HCT: 39.6 % (ref 36.0–46.0)
Hemoglobin: 13.4 g/dL (ref 12.0–15.0)
Immature Granulocytes: 0 %
Lymphocytes Relative: 37 %
Lymphs Abs: 4.6 K/uL — ABNORMAL HIGH (ref 0.7–4.0)
MCH: 29.7 pg (ref 26.0–34.0)
MCHC: 33.8 g/dL (ref 30.0–36.0)
MCV: 87.8 fL (ref 80.0–100.0)
Monocytes Absolute: 1 K/uL (ref 0.1–1.0)
Monocytes Relative: 8 %
Neutro Abs: 6.3 K/uL (ref 1.7–7.7)
Neutrophils Relative %: 52 %
Platelets: 346 K/uL (ref 150–400)
RBC: 4.51 MIL/uL (ref 3.87–5.11)
RDW: 12.6 % (ref 11.5–15.5)
WBC: 12.3 K/uL — ABNORMAL HIGH (ref 4.0–10.5)
nRBC: 0 % (ref 0.0–0.2)

## 2023-11-23 MED ORDER — DOXYCYCLINE HYCLATE 100 MG PO TABS
100.0000 mg | ORAL_TABLET | Freq: Once | ORAL | Status: AC
  Administered 2023-11-23: 100 mg via ORAL
  Filled 2023-11-23: qty 1

## 2023-11-23 MED ORDER — DOXYCYCLINE MONOHYDRATE 100 MG PO TABS: 100.0000 mg | ORAL_TABLET | Freq: Two times a day (BID) | ORAL | 0 refills | Status: AC

## 2023-11-23 MED ORDER — TETANUS-DIPHTH-ACELL PERTUSSIS 5-2.5-18.5 LF-MCG/0.5 IM SUSY
0.5000 mL | PREFILLED_SYRINGE | Freq: Once | INTRAMUSCULAR | Status: AC
  Administered 2023-11-23: 0.5 mL via INTRAMUSCULAR
  Filled 2023-11-23: qty 0.5

## 2023-11-23 MED ORDER — LIDOCAINE-EPINEPHRINE (PF) 2 %-1:200000 IJ SOLN
5.0000 mL | Freq: Once | INTRAMUSCULAR | Status: AC
  Administered 2023-11-23: 5 mL
  Filled 2023-11-23: qty 20

## 2023-11-23 NOTE — ED Notes (Signed)
 See triage note. Lesion is approx 11cm x 6cm with serosanguinous drainage from central scabbed over area. Erythematous and warm to the touch.

## 2023-11-23 NOTE — Discharge Instructions (Addendum)
 You have been seen in the Emergency Department (ED) today for an abscess.  This was drained in the ED. Please pick up and take antibiotics as prescribed.  Please follow up with your doctor or in the ED in 24-48 hours for recheck of your wound.  Read through the additional discharge instructions included below regarding wound care recommendations.  Keep the wound clean and dry, though you may wash as you would normally.  Change the dressing twice daily.  Call your doctor sooner or return to the ED if you develop worsening signs of infection such as: increased redness, increased pain, pus, or fever.   Abscess An abscess is an infected area that contains a collection of pus and debris. It can occur in almost any part of the body. An abscess is also known as a furuncle or boil. CAUSES  An abscess occurs when tissue gets infected. This can occur from blockage of oil or sweat glands, infection of hair follicles, or a minor injury to the skin. As the body tries to fight the infection, pus collects in the area and creates pressure under the skin. This pressure causes pain. People with weakened immune systems have difficulty fighting infections and get certain abscesses more often.  SYMPTOMS Usually an abscess develops on the skin and becomes a painful mass that is red, warm, and tender. If the abscess forms under the skin, you may feel a moveable soft area under the skin. Some abscesses break open (rupture) on their own, but most will continue to get worse without care. The infection can spread deeper into the body and eventually into the bloodstream, causing you to feel ill.  DIAGNOSIS  Your caregiver will take your medical history and perform a physical exam. A sample of fluid may also be taken from the abscess to determine what is causing your infection. TREATMENT  Your caregiver may prescribe antibiotic medicines to fight the infection. However, taking antibiotics alone usually does not cure an abscess.  Your caregiver may need to make a small cut (incision) in the abscess to drain the pus. In some cases, gauze is packed into the abscess to reduce pain and to continue draining the area. HOME CARE INSTRUCTIONS  Only take over-the-counter or prescription medicines for pain, discomfort, or fever as directed by your caregiver. If you were prescribed antibiotics, take them as directed. Finish them even if you start to feel better. If gauze is used, follow your caregiver's directions for changing the gauze. To avoid spreading the infection: Keep your draining abscess covered with a bandage. Wash your hands well. Do not share personal care items, towels, or whirlpools with others. Avoid skin contact with others. Keep your skin and clothes clean around the abscess. Keep all follow-up appointments as directed by your caregiver. SEEK MEDICAL CARE IF:  You have increased pain, swelling, redness, fluid drainage, or bleeding. You have muscle aches, chills, or a general ill feeling. You have a fever. MAKE SURE YOU:  Understand these instructions. Will watch your condition. Will get help right away if you are not doing well or get worse. Document Released: 12/21/2004 Document Revised: 09/12/2011 Document Reviewed: 05/26/2011 [facility] Patient Information 2015 [place], [place]. This information is not intended to replace advice given to you by your health care provider. Make sure you discuss any questions you have with your health care provider.  Abscess Care After An abscess (also called a boil or furuncle) is an infected area that contains a collection of pus. Signs and symptoms of  an abscess include pain, tenderness, redness, or hardness, or you may feel a moveable soft area under your skin. An abscess can occur anywhere in the body. The infection may spread to surrounding tissues causing cellulitis. A cut (incision) by the surgeon was made over your abscess and the pus was drained out. Gauze may have been  packed into the space to provide a drain that will allow the cavity to heal from the inside outwards. The boil may be painful for 5 to 7 days. Most people with a boil do not have high fevers. Your abscess, if seen early, may not have localized, and may not have been lanced. If not, another appointment may be required for this if it does not get better on its own or with medications. HOME CARE INSTRUCTIONS  Only take over-the-counter or prescription medicines for pain, discomfort, or fever as directed by your caregiver. When you bathe, soak and then remove gauze or iodoform packs at least daily or as directed by your caregiver. You may then wash the wound gently with mild soapy water. Repack with gauze or do as your caregiver directs. SEEK IMMEDIATE MEDICAL CARE IF:  You develop increased pain, swelling, redness, drainage, or bleeding in the wound site. You develop signs of generalized infection including muscle aches, chills, fever, or a general ill feeling. An oral temperature above 102 F (38.9 C) develops, not controlled by medication. See your caregiver for a recheck if you develop any of the symptoms described above. If medications (antibiotics) were prescribed, take them as directed. Document Released: 09/29/2004 Document Revised: 06/05/2011 Document Reviewed: 05/27/2007 [facility] Patient Information 2015 [place], [place]. This information is not intended to replace advice given to you by your health care provider. Make sure you discuss any questions you have with your health care provider.  Cellulitis Cellulitis is an infection of the skin and the tissue beneath it. The infected area is usually red and tender. Cellulitis occurs most often in the arms and lower legs.  CAUSES  Cellulitis is caused by bacteria that enter the skin through cracks or cuts in the skin. The most common types of bacteria that cause cellulitis are staphylococci and streptococci. SIGNS AND SYMPTOMS  Redness and  warmth. Swelling. Tenderness or pain. Fever. DIAGNOSIS  Your health care provider can usually determine what is wrong based on a physical exam. Blood tests may also be done. TREATMENT  Treatment usually involves taking an antibiotic medicine. HOME CARE INSTRUCTIONS  Take your antibiotic medicine as directed by your health care provider. Finish the antibiotic even if you start to feel better. Keep the infected arm or leg elevated to reduce swelling. Apply a warm cloth to the affected area up to 4 times per day to relieve pain. Take medicines only as directed by your health care provider. Keep all follow-up visits as directed by your health care provider. SEEK MEDICAL CARE IF:  You notice red streaks coming from the infected area. Your red area gets larger or turns dark in color. Your bone or joint underneath the infected area becomes painful after the skin has healed. Your infection returns in the same area or another area. You notice a swollen bump in the infected area. You develop new symptoms. You have a fever. SEEK IMMEDIATE MEDICAL CARE IF:  You feel very sleepy. You develop vomiting or diarrhea. You have a general ill feeling (malaise) with muscle aches and pains. MAKE SURE YOU:  Understand these instructions. Will watch your condition. Will get help right away  if you are not doing well or get worse. Document Released: 12/21/2004 Document Revised: 07/28/2013 Document Reviewed: 05/29/2011 [facility] Patient Information 2015 [place], [place]. This information is not intended to replace advice given to you by your health care provider. Make sure you discuss any questions you have with your health care provider.

## 2023-11-23 NOTE — ED Provider Notes (Signed)
 [facility] Provider Note    Event Date/Time   First MD Initiated Contact with Patient 11/23/23 2122     (approximate)   History   Skin Problem   HPI  Mary Bradford is a 38 y.o. female  with a past medical history of PCOS, migraines, depression, family history of AML presents to the emergency department with right posterior leg pain x 5 days.  Patient states she was seen via televisit 2 days ago, prescribed cefdinir , and the area has become worse with puslike drainage.  Patient denies nausea, vomiting, fever, chills, increasing pain down or up her leg.  Patient reports 2 of her kids have had similar symptoms of an abscess with surrounding cellulitis in a different body part.  One of the kids was given Bactrim, got worse, had a culture done and placed on cefdinir  and area resolved.  Patient denies being bit by something to her knowledge.  [place]  Unsure of last tetanus shot.    Physical Exam   Triage Vital Signs: ED Triage Vitals  Encounter Vitals Group     BP 11/23/23 2115 (!) 151/92     Girls Systolic BP Percentile --      Girls Diastolic BP Percentile --      Boys Systolic BP Percentile --      Boys Diastolic BP Percentile --      Pulse Rate 11/23/23 2115 (!) 107     Resp 11/23/23 2115 16     Temp 11/23/23 2115 98.7 F (37.1 C)     Temp Source 11/23/23 2115 Oral     SpO2 11/23/23 2115 98 %     Weight --      Height --      Head Circumference --      Peak Flow --      Pain Score 11/23/23 2148 1     Pain Loc --      Pain Education --      Exclude from Growth Chart --     Most recent vital signs: Vitals:   11/23/23 2151 11/23/23 2335  BP: 130/86 137/85  Pulse: 91 86  Resp: 18 18  Temp:    SpO2: 98% 99%    General: Awake, in no acute distress. Appears stated age. Neck: Supple, no lymphadenopathy, no nuchal rigidity. CV: Good peripheral perfusion. No edema.  Respiratory:Normal respiratory effort.   No respiratory distress.  GI: Soft, non-distended, non-tender.  MSK: Normal ROM in b/l lower extremities. Skin:Warm, dry. 2x2 cm fluctuance with overlying scab and 8x6 surrounding erythema, mild TTP. Neurological: A&Ox4.  ED Results / Procedures / Treatments   Labs (all labs ordered are listed, but only abnormal results are displayed) Labs Reviewed  CBC WITH DIFFERENTIAL/PLATELET - Abnormal; Notable for the following components:      Result Value   WBC 12.3 (*)    Lymphs Abs 4.6 (*)    All other components within normal limits     EKG     RADIOLOGY    PROCEDURES:  Critical Care performed: No   .Incision and Drainage  Date/Time: 11/23/2023 11:41 PM  Performed by: Sheron Salm, PA-C Authorized by: Sheron Salm, PA-C   Consent:    Consent obtained:  Verbal   Consent given by:  Patient   Risks, benefits, and alternatives were discussed: yes     Risks discussed:  Bleeding, damage to other organs, infection, incomplete drainage and pain   Alternatives discussed:  Alternative treatment Universal protocol:    Procedure explained and questions answered to patient or proxy's satisfaction: yes     Immediately prior to procedure, a time out was called: yes     Patient identity confirmed:  Verbally with patient Location:    Type:  Abscess   Size:  2x2   Location:  Lower extremity   Lower extremity location:  Leg   Leg location:  R upper leg Pre-procedure details:    Skin preparation:  Povidone-iodine Sedation:    Sedation type:  None Anesthesia:    Anesthesia method:  Local infiltration   Local anesthetic:  Lidocaine  2% WITH epi Procedure type:    Complexity:  Simple Procedure details:    Ultrasound guidance: no     Incision types:  Single straight   Incision depth:  Subcutaneous   Wound management:  Irrigated with saline and probed and deloculated   Drainage:  Serosanguinous   Drainage amount:  Moderate   Wound treatment:  Wound left open   Packing  materials:  None Post-procedure details:    Procedure completion:  Tolerated well, no immediate complications    MEDICATIONS ORDERED IN ED: Medications  lidocaine -EPINEPHrine  (XYLOCAINE  W/EPI) 2 %-1:200000 (PF) injection 5 mL (5 mLs Infiltration Given 11/23/23 2254)  doxycycline  (VIBRA -TABS) tablet 100 mg (100 mg Oral Given 11/23/23 2252)  Tdap (BOOSTRIX ) injection 0.5 mL (0.5 mLs Intramuscular Given 11/23/23 2253)     IMPRESSION / MDM / ASSESSMENT AND PLAN / ED COURSE  I reviewed the triage vital signs and the nursing notes.                              Differential diagnosis includes, but is not limited to, cellulitis, abscess, cyst  Patient's presentation is most consistent with acute complicated illness / injury requiring diagnostic workup.  Patient is a 38 year old female who presented with symptoms of cellulitis with overlying abscess scabbed over. Patient's white blood cell count is elevated at 12.3 and heart rate is 107 on arrival, 91 on recheck.  Patient is well-appearing, has mild tenderness over the area.  No systemic symptoms such as fever or chills or pain up and down her leg other than the abscessed area. Met SIRS criteria, however, patient reports her white blood cell count has been elevated in the past multiple times and she has seen an oncologist for it due to her sister passing at age 2 from AML.  Review of previous labs shows elevations of white blood cell count 3 months ago at 14, 4 months ago at 10.7, 9 months ago at 11.7, 10 months ago at 11.8.  Also reviewed her note from urgent care from 8/27, where patient was placed on cefdinir  due to concern for staph skin infection, but has gotten worse since then.  Consulted supervising physician, Dr. Jacolyn, who saw the patient and recommended I&D, start on doxycycline  with continuation of cefdinir . Will not do sepsis workup at this time.  Please see procedure note for details regarding I&D, lidocaine  2% with epi, 5 mL used for  anesthesia.  First dose of doxycycline  provided here.  Tdap updated.  Remainder of Doxy will be sent to pharmacy of choice.  Strict ED return precautions discussed.  Would like her to have a wound recheck in 24 to 48 hours either with her primary care provider, an urgent care, or in the ED.  The patient may return to the emergency department for any new, worsening,  or concerning symptoms. Patient was given the opportunity to ask questions; all questions were answered. Emergency department return precautions were discussed with the patient.  Patient is in agreement to the treatment plan.  Patient is stable for discharge.    FINAL CLINICAL IMPRESSION(S) / ED DIAGNOSES   Final diagnoses:  Cellulitis and abscess of right lower extremity     Rx / DC Orders   ED Discharge Orders          Ordered    doxycycline  (ADOXA) 100 MG tablet  2 times daily        11/23/23 2324             Note:  This document was prepared using Dragon voice recognition software and may include unintentional dictation errors.     Sheron Salm, PA-C 11/23/23 2349    Jacolyn Pae, MD 11/23/23 [phone]

## 2023-11-23 NOTE — ED Triage Notes (Addendum)
 Wound area on posterior R thigh that started out looking like a pimple. Progressively has become worse to a dime size red and warm. Has been on PO antibiotics x 3 days however the area is becoming worse with drainage and pus. Denies NV or fever

## 2023-11-26 ENCOUNTER — Ambulatory Visit: Admission: EM | Admit: 2023-11-26 | Discharge: 2023-11-26 | Disposition: A | Discharge status: Home / Self Care

## 2023-11-26 DIAGNOSIS — L0291 Cutaneous abscess, unspecified: Secondary | ICD-10-CM | POA: Diagnosis not present

## 2023-11-26 NOTE — ED Provider Notes (Signed)
 CAY RALPH PELT    CSN: [id number] Arrival date & time: 11/26/23  1205      History   Chief Complaint Chief Complaint  Patient presents with   Wound Check    HPI Mary Bradford is a 38 y.o. female.  Patient presents with an abscess on her right posterior thigh.  She is currently on doxycycline .  She states the area is improving, with decreased redness.  It is still draining a small amount of purulent drainage.  No fever or chills.  Patient was seen at [facility] ED on 11/23/2023 for cellulitis and abscess of right lower extremity; I&D performed; tetanus updated; treated with doxycycline .  The history is provided by the patient and medical records.    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   History of depression 02/03/2021   Rh negative state in antepartum period 08/26/2020   Family history of AML (acute myeloid leukemia) 04/04/2019   Migraines 03/07/2019   BMI 40.0-44.9, adult (HCC) 03/07/2019   Obesity 03/07/2019   PCOS (polycystic ovarian syndrome) 08/04/2014    Past Surgical History:  Procedure Laterality Date   CESAREAN SECTION N/A 05/12/2017   Procedure: CESAREAN SECTION;  Surgeon: Henry Slough, MD;  Location: [facility] BIR[facility]ING SUITES;  Service: Obstetrics;  Laterality: N/A;  RNFA Requested OPERATIVE ULTRASOUND BEFORE CESAREAN TO CONFIRM PRESENTATION   egg  retrieval     WISDOM TOOTH EXTRACTION      OB History     Gravida  3   Para  2   Term  2   Preterm      AB  1   Living  2      SAB  1   IAB      Ectopic      Multiple  0   Live Births  2             Home Medications    Prior to Admission medications   Medication Sig Start Date End Date Taking? Authorizing Provider  cefdinir  (OMNICEF ) 300 MG capsule Take 1 capsule (300 mg total) by mouth 2 (two) times daily. 11/21/23   Gladis Elsie BROCKS, PA-C  doxycycline  (ADOXA) 100 MG tablet Take 1 tablet (100 mg total) by mouth 2 (two) times daily for 7 days. 11/23/23 11/30/23  Sheron, Summer, PA-C  triamcinolone  0.1%-Eucerin equivalent 1:1 cream mixture Apply topically 2 (two) times daily as needed. 10/24/23   Gladis Elsie BROCKS, PA-C    Family History Family History  Problem Relation Age of Onset   Kidney Stones Mother    Hypertension Mother    Hypertension Father    Kidney Stones Father    Migraines Father    Diabetes Father    Obesity Father    Irritable bowel syndrome Sister    Migraines Sister    Kidney disease Sister    Leukemia Sister    Cancer Sister    Early death Sister    Obesity Sister    Heart disease Maternal Grandmother    Diabetes Maternal Grandmother  Heart failure Maternal Grandmother    Diabetes Maternal Grandfather    Dementia Maternal Grandfather    Non-Hodgkin's lymphoma Paternal Grandfather    Leukemia Paternal Grandfather    Hodgkin's lymphoma Paternal Grandfather    Cancer Paternal Grandfather    Hearing loss Paternal Grandfather    Leukemia Maternal Uncle    Cancer Maternal Uncle    Early death Maternal Uncle     Social History Social History   Tobacco Use   Smoking status: Never   Smokeless tobacco: Never  Vaping Use   Vaping status: Never Used  Substance Use Topics   Alcohol use: Not Currently    Alcohol/week: 1.0 standard drink of alcohol    Types: 1 Glasses of wine per week   Drug use: No     Allergies   Amoxicillin   Review of Systems Review of Systems  Constitutional:  Negative for chills and fever.  Musculoskeletal:  Negative for arthralgias, gait problem and joint swelling.  Skin:  Positive for color change and wound.   Neurological:  Negative for weakness and numbness.     Physical Exam Triage Vital Signs ED Triage Vitals  Encounter Vitals Group     BP 11/26/23 1231 129/86     Girls Systolic BP Percentile --      Girls Diastolic BP Percentile --      Boys Systolic BP Percentile --      Boys Diastolic BP Percentile --      Pulse Rate 11/26/23 1231 92     Resp 11/26/23 1231 18     Temp 11/26/23 1231 98 F (36.7 C)     Temp src --      SpO2 11/26/23 1231 98 %     Weight --      Height --      Head Circumference --      Peak Flow --      Pain Score 11/26/23 1227 1     Pain Loc --      Pain Education --      Exclude from Growth Chart --    No data found.  Updated Vital Signs BP 129/86   Pulse 92   Temp 98 F (36.7 C)   Resp 18   LMP 11/20/2023 (Exact Date)   SpO2 98%   Visual Acuity Right Eye Distance:   Left Eye Distance:   Bilateral Distance:    Right Eye Near:   Left Eye Near:    Bilateral Near:     Physical Exam Constitutional:      General: She is not in acute distress. HENT:     Mouth/Throat:     Mouth: Mucous membranes are moist.  Cardiovascular:     Rate and Rhythm: Normal rate and regular rhythm.  Pulmonary:     Effort: Pulmonary effort is normal. No respiratory distress.  Musculoskeletal:        General: No swelling or deformity. Normal range of motion.  Skin:    General: Skin is warm and dry.     Capillary Refill: Capillary refill takes less than 2 seconds.     Findings: Erythema and lesion present.     Comments: Right posterior thigh with dime-sized open wound with scant purulent drainage and faint localized erythema which is well within the markings made by the ED.  Neurological:     General: No focal deficit present.     Mental Status: She is alert.     Sensory: No sensory deficit.  Motor: No weakness.     Gait: Gait normal.      UC Treatments / Results  Labs (all labs ordered are listed, but only abnormal results are displayed) Labs  Reviewed - No data to display  EKG   Radiology No results found.  Procedures Procedures (including critical care time)  Medications Ordered in UC Medications - No data to display  Initial Impression / Assessment and Plan / UC Course  I have reviewed the triage vital signs and the nursing notes.  Pertinent labs & imaging results that were available during my care of the patient were reviewed by me and considered in my medical decision making (see chart for details).    Abscess of right posterior thigh.  Afebrile and vital signs are stable.  The area appears to be healing.  Instructed patient to continue taking doxycycline .  Wound care instructions and signs of worsening infection discussed.  Education provided on abscess.  Instructed patient to follow-up with her PCP in 2 to 3 days.  ED precautions discussed.  She agrees to plan of care.  Final Clinical Impressions(s) / UC Diagnoses   Final diagnoses:  Abscess     Discharge Instructions      Continue taking the doxycycline  as directed.  Follow-up with your primary care provider in 2 to 3 days for a wound recheck.  Go to the emergency department if you have signs of worsening infection such as increased drainage, increased redness, fever, chills.     ED Prescriptions   None    PDMP not reviewed this encounter.   Corlis Burnard DEL, NP 11/26/23 1258

## 2023-11-26 NOTE — Discharge Instructions (Addendum)
 Continue taking the doxycycline  as directed.  Follow-up with your primary care provider in 2 to 3 days for a wound recheck.  Go to the emergency department if you have signs of worsening infection such as increased drainage, increased redness, fever, chills.

## 2023-11-26 NOTE — ED Triage Notes (Signed)
 Patient to Urgent Care with complaints of a wound to her right leg (posterior R thigh).   Had an abscess drained on Friday at ER- was told to have the area reevaluated. Area is still draining. Improvement in pain/ redness.   Taking tylenol .

## 2023-11-27 LAB — FSH/LH
FSH: 8.1 m[IU]/mL
LH: 4.9 m[IU]/mL

## 2023-11-27 LAB — TESTOSTERONE,FREE AND TOTAL
Testosterone, Free: 0.7 pg/mL (ref 0.0–4.2)
Testosterone: 20 ng/dL (ref 8–60)

## 2023-11-27 LAB — ESTROGENS, TOTAL: Estrogen: 130 pg/mL

## 2023-11-27 LAB — ANTI MULLERIAN HORMONE: ANTI-MULLERIAN HORMONE (AMH): 0.633 ng/mL

## 2023-11-28 ENCOUNTER — Ambulatory Visit: Payer: Self-pay | Admitting: Certified Nurse Midwife

## 2023-11-29 ENCOUNTER — Encounter: Payer: Self-pay | Admitting: Nurse Practitioner

## 2023-11-29 ENCOUNTER — Ambulatory Visit: Admitting: Nurse Practitioner

## 2023-11-29 VITALS — BP 132/87 | HR 91 | Ht 68.0 in | Wt 275.0 lb

## 2023-11-29 DIAGNOSIS — Z23 Encounter for immunization: Secondary | ICD-10-CM

## 2023-11-29 DIAGNOSIS — B958 Unspecified staphylococcus as the cause of diseases classified elsewhere: Secondary | ICD-10-CM | POA: Insufficient documentation

## 2023-11-29 DIAGNOSIS — L089 Local infection of the skin and subcutaneous tissue, unspecified: Secondary | ICD-10-CM

## 2023-11-29 MED ORDER — MUPIROCIN 2 % EX OINT: 1.0000 | TOPICAL_OINTMENT | Freq: Two times a day (BID) | CUTANEOUS | 0 refills | Status: AC

## 2023-11-29 MED ORDER — CEFDINIR 300 MG PO CAPS
300.0000 mg | ORAL_CAPSULE | Freq: Two times a day (BID) | ORAL | 0 refills | Status: AC
Start: 2023-11-29 — End: 2023-12-02

## 2023-11-29 NOTE — Patient Instructions (Signed)
 Cellulitis, Adult    Cellulitis is a skin infection. The infected area is often warm, red, swollen, and sore. It occurs most often on the legs, feet, and toes, but can happen on any part of the body.  This condition can be life-threatening without treatment. It is very important to get treated right away.  What are the causes?  This condition is caused by bacteria. The bacteria enter through a break in the skin, such as:  A cut.  A burn.  A bug bite.  An animal bite.  An open sore.  A crack.  What increases the risk?  Having a weak body's defense system (immune system).  Being older than 38 years old.  Having a blood sugar problem (diabetes).  Having a long-term liver disease (cirrhosis) or kidney disease.  Being very overweight (obese).  Having a skin problem, such as:  An itchy rash.  A rash caused by a fungus.  A rash with blisters.  Slow movement of blood in the veins (venous stasis).  Fluid buildup below the skin (edema).  This condition is more likely to occur in people who:  Have open cuts, burns, bites, or scrapes on the skin.  Have been treated with high-energy rays (radiation).  Use IV drugs.  What are the signs or symptoms?  Skin that:  Looks red or purple, or slightly darker than your usual skin color.  Has streaks.  Has spots.  Is swollen.  Is sore or painful when you touch it.  Is warm.  A fever.  Chills.  Blisters.  Tiredness (fatigue).  How is this treated?  Medicines to treat infections or allergies.  Rest.  Placing cold or warm cloths on the skin.  Staying in the hospital, if the condition is very bad. You may need medicines through an IV.  Follow these instructions at home:  Medicines  Take over-the-counter and prescription medicines only as told by your doctor.  If you were prescribed antibiotics, take them as told by your doctor. Do not stop using them even if you start to feel better.  General instructions  Drink enough fluid to keep your pee (urine) pale yellow.  Do not touch or rub the  infected area.  Raise (elevate) the infected area above the level of your heart while you are sitting or lying down.  Return to your normal activities when your doctor says that it is safe.  Place cold or warm cloths on the area as told by your doctor.  Keep all follow-up visits. Your doctor will need to make sure that a more serious infection is not developing.  Contact a doctor if:  You have a fever.  You do not start to get better after 1-2 days of treatment.  Your bone or joint under the infected area starts to hurt after the skin has healed.  Your infection comes back in the same area or another area. Signs of this may include:  You have a swollen bump in the area.  Your red area gets larger, turns dark in color, or hurts more.  You have more fluid coming from the wound.  Pus or a bad smell develops in your infected area.  You have more pain.  You feel sick and have muscle aches and weakness.  You develop vomiting or watery poop that will not go away.  Get help right away if:  You see red streaks coming from the area.  You notice the skin turns purple or black and falls  off.  These symptoms may be an emergency. Get help right away. Call 911.  Do not wait to see if the symptoms will go away.  Do not drive yourself to the hospital.  This information is not intended to replace advice given to you by your health care provider. Make sure you discuss any questions you have with your health care provider.  Document Revised: 11/08/2021 Document Reviewed: 11/08/2021  Elsevier Patient Education  2024 [employer].

## 2023-11-29 NOTE — Assessment & Plan Note (Signed)
 To right upper leg, overall is improving.  Less erythema and warmth.  Will send in 3 more days of Cefdinir  which she tolerated well.  Recommend monitor area closely and is any worsening symptoms to alert PCP immediately and return to office.

## 2023-11-29 NOTE — Progress Notes (Signed)
 BP 132/87   Pulse 91   Ht 5' 8 (1.727 m)   Wt 275 lb (124.7 kg)   LMP 11/20/2023 (Exact Date)   SpO2 98%   BMI 41.81 kg/m    Subjective:    Patient ID: Mary Bradford, female    DOB: 15-Oct-1985, 38 y.o.   MRN: [id number]  HPI: Mary Bradford is a 38 y.o. female  Chief Complaint  Patient presents with   Hospitalization Follow-up   ER FOLLOW UP Seen in ER on 11/23/23 and 11/26/23 for cellulitis and abscess. To right posterior leg.  No injuries prior to this presenting.  Both kids had similar prior to her.  Started on Doxycycline  at time and continue Cefdinir .  Has completed Cefdinir , continues Doxy.  Had mild elevation in WBC during that time.  Is followed by hematology for iron  deficiency anemia, receiving infusions. Time since discharge: 11/26/23 Hospital/facility: [facility] Diagnosis: Cellulitis Procedures/tests: CBC and I&D Consultants: none New medications: Doxycycline  Discharge instructions:  follow-up with PCP Status: stable      11/29/2023    9:12 AM 07/13/2023    1:30 PM 02/05/2023    2:14 PM 01/01/2023    1:12 PM 08/07/2022    3:32 PM  Depression screen PHQ 2/9  Decreased Interest 0 0 0 0 1  Down, Depressed, Hopeless 1 1 0 1 2  PHQ - 2 Score 1 1 0 1 3  Altered sleeping 1 1  1 1   Tired, decreased energy 3 2  1 1   Change in appetite 0 0  0 0  Feeling bad or failure about yourself  0 0  0 0  Trouble concentrating 2 0  0 0  Moving slowly or fidgety/restless 2 0  0 0  Suicidal thoughts 0 0   0  PHQ-9 Score 9 4  3 5   Difficult doing work/chores  Somewhat difficult  Not difficult at all Not difficult at all       11/29/2023    9:12 AM 07/13/2023    1:31 PM 01/01/2023    1:12 PM 08/07/2022    3:32 PM  GAD 7 : Generalized Anxiety Score  Nervous, Anxious, on Edge 3 2 1 2   Control/stop worrying 2 1 0 2  Worry too much - different things 3 2 1 2   Trouble relaxing 3 2 1 2   Restless 2 0 0 1  Easily annoyed or irritable 1 1 1 1   Afraid - awful might happen 0 0 0  0  Total GAD 7 Score 14 8 4 10   Anxiety Difficulty  Somewhat difficult Somewhat difficult Not difficult at all   Relevant past medical, surgical, family and social history reviewed and updated as indicated. Interim medical history since our last visit reviewed. Allergies and medications reviewed and updated.  Review of Systems  Constitutional:  Negative for activity change, appetite change, diaphoresis, fatigue and fever.  Respiratory:  Negative for cough, chest tightness, shortness of breath and wheezing.   Cardiovascular:  Negative for chest pain, palpitations and leg swelling.  Gastrointestinal: Negative.   Skin:  Positive for wound.  Neurological: Negative.   Psychiatric/Behavioral: Negative.      Per HPI unless specifically indicated above     Objective:    BP 132/87   Pulse 91   Ht 5' 8 (1.727 m)   Wt 275 lb (124.7 kg)   LMP 11/20/2023 (Exact Date)   SpO2 98%   BMI 41.81 kg/m   Wt Readings from Last 3 Encounters:  11/29/23  275 lb (124.7 kg)  11/20/23 274 lb 9.6 oz (124.6 kg)  09/18/23 274 lb (124.3 kg)    Physical Exam Vitals and nursing note reviewed.  Constitutional:      General: She is awake. She is not in acute distress.    Appearance: She is well-developed and well-groomed. She is obese. She is not ill-appearing or toxic-appearing.  HENT:     Head: Normocephalic.     Right Ear: Hearing and external ear normal.     Left Ear: Hearing and external ear normal.  Eyes:     General: Lids are normal.        Right eye: No discharge.        Left eye: No discharge.     Conjunctiva/sclera: Conjunctivae normal.     Pupils: Pupils are equal, round, and reactive to light.  Neck:     Thyroid : No thyromegaly.     Vascular: No carotid bruit.  Cardiovascular:     Rate and Rhythm: Normal rate and regular rhythm.     Heart sounds: Normal heart sounds. No murmur heard.    No gallop.  Pulmonary:     Effort: Pulmonary effort is normal. No accessory muscle usage or  respiratory distress.     Breath sounds: Normal breath sounds.  Abdominal:     General: Bowel sounds are normal. There is no distension.     Palpations: Abdomen is soft.     Tenderness: There is no abdominal tenderness.  Musculoskeletal:     Cervical back: Normal range of motion and neck supple.     Right lower leg: No edema.     Left lower leg: No edema.  Lymphadenopathy:     Cervical: No cervical adenopathy.  Skin:    General: Skin is warm and dry.     Findings: Wound present.      Neurological:     Mental Status: She is alert and oriented to person, place, and time.     Deep Tendon Reflexes: Reflexes are normal and symmetric.     Reflex Scores:      Brachioradialis reflexes are 2+ on the right side and 2+ on the left side.      Patellar reflexes are 2+ on the right side and 2+ on the left side. Psychiatric:        Attention and Perception: Attention normal.        Mood and Affect: Mood normal.        Speech: Speech normal.        Behavior: Behavior normal. Behavior is cooperative.        Thought Content: Thought content normal.    Results for orders placed or performed during the hospital encounter of 11/23/23  CBC with Differential   Collection Time: 11/23/23  9:17 PM  Result Value Ref Range   WBC 12.3 (H) 4.0 - 10.5 K/uL   RBC 4.51 3.87 - 5.11 MIL/uL   Hemoglobin 13.4 12.0 - 15.0 g/dL   HCT 60.3 63.9 - 53.9 %   MCV 87.8 80.0 - 100.0 fL   MCH 29.7 26.0 - 34.0 pg   MCHC 33.8 30.0 - 36.0 g/dL   RDW 87.3 88.4 - 84.4 %   Platelets 346 150 - 400 K/uL   nRBC 0.0 0.0 - 0.2 %   Neutrophils Relative % 52 %   Neutro Abs 6.3 1.7 - 7.7 K/uL   Lymphocytes Relative 37 %   Lymphs Abs 4.6 (H) 0.7 - 4.0 K/uL  Monocytes Relative 8 %   Monocytes Absolute 1.0 0.1 - 1.0 K/uL   Eosinophils Relative 3 %   Eosinophils Absolute 0.3 0.0 - 0.5 K/uL   Basophils Relative 0 %   Basophils Absolute 0.1 0.0 - 0.1 K/uL   Immature Granulocytes 0 %   Abs Immature Granulocytes 0.03 0.00 - 0.07  K/uL      Assessment & Plan:   Problem List Items Addressed This Visit       Musculoskeletal and Integument   Staph skin infection - Primary   To right upper leg, overall is improving.  Less erythema and warmth.  Will send in 3 more days of Cefdinir  which she tolerated well.  Recommend monitor area closely and is any worsening symptoms to alert PCP immediately and return to office.      Relevant Medications   cefdinir  (OMNICEF ) 300 MG capsule   mupirocin  ointment (BACTROBAN ) 2 %   Other Visit Diagnoses       Flu vaccine need       Flu vaccine today.   Relevant Orders   Flu vaccine trivalent PF, 6mos and older(Flulaval,Afluria,Fluarix,Fluzone) (Completed)        Follow up plan: Return for return as scheduled in October.

## 2023-12-29 NOTE — Patient Instructions (Signed)
 Be Involved in Caring For Your Health:  Taking Medications When medications are taken as directed, they can greatly improve your health. But if they are not taken as prescribed, they may not work. In some cases, not taking them correctly can be harmful. To help ensure your treatment remains effective and safe, understand your medications and how to take them. Bring your medications to each visit for review by your provider.  Your lab results, notes, and after visit summary will be available on My Chart. We strongly encourage you to use this feature. If lab results are abnormal the clinic will contact you with the appropriate steps. If the clinic does not contact you assume the results are satisfactory. You can always view your results on My Chart. If you have questions regarding your health or results, please contact the clinic during office hours. You can also ask questions on My Chart.  We at [facility] are grateful that you chose us  to provide your care. We strive to provide evidence-based and compassionate care and are always looking for feedback. If you get a survey from the clinic please complete this so we can hear your opinions.   Diet for Polycystic Ovary Syndrome Polycystic ovary syndrome (PCOS) is a common hormonal disorder that affects a woman's reproductive system. It can cause problems with menstrual periods and make it hard to get and stay pregnant. Changing what you eat can help your hormones reach normal levels, improve your health, and help you better manage PCOS. Following a balanced diet can help you lose weight and improve the way that your body uses the hormone insulin to control blood sugar. This may include: Eating low-fat (lean) proteins, complex carbohydrates, fresh fruits and vegetables, low-fat dairy products, healthy fats, and fiber. Cutting down on calories. Exercising regularly. What are tips for following this plan? Follow a balanced diet for meals and  snacks. Eat breakfast, lunch, dinner, and one or two snacks every day. Include protein in each meal and snack. Choose whole grains instead of products that are made with refined flour. Eat a variety of foods. Exercise regularly as told by your health care provider. Aim to do at least 30 minutes of exercise on most days of the week. If you are overweight or obese: Pay attention to how many calories you eat. Cutting down on calories can help you lose weight. Work with your health care provider or a dietitian to figure out how many calories you need each day. What foods should I eat?  Fruits Include a variety of colors and types. All fruits are helpful for PCOS. Vegetables Include a variety of colors and types. All vegetables are helpful for PCOS. Grains Whole grains, such as whole wheat. Whole-grain breads, crackers, cereals, and pasta. Unsweetened oatmeal. Bulgur, barley, quinoa, and brown rice. Tortillas made from corn or whole-wheat flour. Meats and other proteins Lean proteins, such as fish, chicken, beans, eggs, and tofu. Dairy Low-fat dairy products, such as skim milk, cheese sticks, and yogurt. Beverages Low-fat or fat-free drinks, such as water, low-fat milk, sugar-free drinks, and small amounts of 100% fruit juice. Seasonings and condiments Ketchup. Mustard. Barbecue sauce. Relish. Low-fat or fat-free mayonnaise. Fats and oils Olive oil or canola oil. Walnuts and almonds. The items listed above may not be a complete list of recommended foods and beverages. Contact a dietitian for more options. What foods should I avoid? Foods that are high in calories or fat, especially saturated or trans fats. Fried foods. Sweets. Products that  are made from refined white flour, including white bread, pastries, white rice, and pasta. The items listed above may not be a complete list of foods and beverages to avoid. Contact a dietitian for more information. Summary PCOS is a hormonal imbalance  that affects a woman's reproductive system. It can cause problems with menstrual periods and make it hard to get and stay pregnant. You can help to manage your PCOS by exercising regularly and eating a healthy, varied diet of vegetables, fruit, whole grains, lean protein, and low-fat dairy products. Changing what you eat can improve the way that your body uses insulin, help your hormones reach normal levels, and help you lose weight. This information is not intended to replace advice given to you by your health care provider. Make sure you discuss any questions you have with your health care provider. Document Revised: 01/29/2023 Document Reviewed: 01/29/2023 Elsevier Patient Education  2024 [employer].

## 2024-01-02 ENCOUNTER — Ambulatory Visit (INDEPENDENT_AMBULATORY_CARE_PROVIDER_SITE_OTHER): Payer: Self-pay | Admitting: Nurse Practitioner

## 2024-01-02 ENCOUNTER — Encounter: Payer: Self-pay | Admitting: Nurse Practitioner

## 2024-01-02 VITALS — BP 131/89 | HR 111 | Temp 98.2°F | Resp 15 | Ht 67.99 in | Wt 272.0 lb

## 2024-01-02 DIAGNOSIS — Z6841 Body Mass Index (BMI) 40.0 and over, adult: Secondary | ICD-10-CM | POA: Diagnosis not present

## 2024-01-02 DIAGNOSIS — E282 Polycystic ovarian syndrome: Secondary | ICD-10-CM | POA: Diagnosis not present

## 2024-01-02 DIAGNOSIS — L309 Dermatitis, unspecified: Secondary | ICD-10-CM | POA: Insufficient documentation

## 2024-01-02 DIAGNOSIS — Z0000 Encounter for general adult medical examination without abnormal findings: Secondary | ICD-10-CM

## 2024-01-02 DIAGNOSIS — E66813 Obesity, class 3: Secondary | ICD-10-CM | POA: Diagnosis not present

## 2024-01-02 DIAGNOSIS — D508 Other iron deficiency anemias: Secondary | ICD-10-CM | POA: Diagnosis not present

## 2024-01-02 DIAGNOSIS — L2084 Intrinsic (allergic) eczema: Secondary | ICD-10-CM

## 2024-01-02 DIAGNOSIS — G43009 Migraine without aura, not intractable, without status migrainosus: Secondary | ICD-10-CM

## 2024-01-02 MED ORDER — TRIAMCINOLONE ACETONIDE 0.1 % EX CREA: 1.0000 | TOPICAL_CREAM | Freq: Two times a day (BID) | CUTANEOUS | 4 refills | Status: AC

## 2024-01-02 NOTE — Assessment & Plan Note (Signed)
Recommend  focus on healthy diet choices and regular physical activity (30 minutes 5 days a week).  Focus on small goals at a time.  

## 2024-01-02 NOTE — Assessment & Plan Note (Signed)
 Over past months more prominent.  Will trial Triamcinolone  cream to areas as needed.  Recommend changing to products without scent and gentle soap.  Avoid irritants when possible. If worsening return to office.

## 2024-01-02 NOTE — Progress Notes (Signed)
 BP 131/89 (BP Location: Left Arm, Patient Position: Sitting, Cuff Size: Large)   Pulse (!) 111   Temp 98.2 F (36.8 C) (Oral)   Resp 15   Ht 5' 7.99 (1.727 m)   Wt 272 lb (123.4 kg)   LMP 12/14/2023 (Exact Date)   SpO2 98%   BMI 41.37 kg/m    Subjective:    Patient ID: Mary Bradford, female    DOB: June 14, 1985, 38 y.o.   MRN: [id number]  HPI: Mary Bradford is a 38 y.o. female presenting on 01/02/2024 for comprehensive medical examination. Current medical complaints include:none  She currently lives with: significant other Menopausal Symptoms: no  Having itchy patches to skin, on fingers. In between fingers and elbows + one patch to upper right leg. Has been present since the spring.  Depression Screen done today and results listed below:     01/02/2024    1:57 PM 11/29/2023    9:12 AM 07/13/2023    1:30 PM 02/05/2023    2:14 PM 01/01/2023    1:12 PM  Depression screen PHQ 2/9  Decreased Interest 0 0 0 0 0  Down, Depressed, Hopeless 0 1 1 0 1  PHQ - 2 Score 0 1 1 0 1  Altered sleeping 0 1 1  1   Tired, decreased energy 0 3 2  1   Change in appetite 0 0 0  0  Feeling bad or failure about yourself  0 0 0  0  Trouble concentrating 0 2 0  0  Moving slowly or fidgety/restless 0 2 0  0  Suicidal thoughts 0 0 0    PHQ-9 Score 0 9 4  3   Difficult doing work/chores   Somewhat difficult  Not difficult at all      01/02/2024    1:57 PM 11/29/2023    9:12 AM 07/13/2023    1:31 PM 01/01/2023    1:12 PM  GAD 7 : Generalized Anxiety Score  Nervous, Anxious, on Edge 0 3 2 1   Control/stop worrying 0 2 1 0  Worry too much - different things 0 3 2 1   Trouble relaxing 0 3 2 1   Restless 0 2 0 0  Easily annoyed or irritable 0 1 1 1   Afraid - awful might happen 0 0 0 0  Total GAD 7 Score 0 14 8 4   Anxiety Difficulty   Somewhat difficult Somewhat difficult      01/03/2022    2:26 PM 03/31/2022    3:58 PM 01/01/2023    1:12 PM 07/13/2023    1:30 PM 01/02/2024    1:57 PM   Fall Risk  Falls in the past year? 0 0 0 0 0  Was there an injury with Fall? 0 0 0 0 0  Fall Risk Category Calculator 0 0 0 0 0  Fall Risk Category (Retired) Low  Low      (RETIRED) Patient Fall Risk Level Low fall risk       Patient at Risk for Falls Due to No Fall Risks No Fall Risks No Fall Risks No Fall Risks No Fall Risks  Fall risk Follow up Falls evaluation completed  Falls evaluation completed  Falls evaluation completed Falls evaluation completed Falls evaluation completed     Data saved with a previous flowsheet row definition    Functional Status Survey: Is the patient deaf or have difficulty hearing?: No Does the patient have difficulty seeing, even when wearing glasses/contacts?: No Does the patient have difficulty concentrating, remembering,  or making decisions?: No Does the patient have difficulty walking or climbing stairs?: No Does the patient have difficulty dressing or bathing?: No Does the patient have difficulty doing errands alone such as visiting a doctor's office or shopping?: No            Past Medical History:  Past Medical History:  Diagnosis Date   Anemia 2025   Anxiety 2021   BMI 40.0-44.9, adult (HCC)    Depression    History of PCOS    Migraines    Miscarriage    2018   Newborn product of in vitro fertilization (IVF) pregnancy    PCOS (polycystic ovarian syndrome)     Surgical History:  Past Surgical History:  Procedure Laterality Date   CESAREAN SECTION N/A 05/12/2017   Procedure: CESAREAN SECTION;  Surgeon: Henry Slough, MD;  Location: [facility] BIRTHING SUIT[facility];  Service: Obstetrics;  Laterality: N/A;  RNFA Requested OPERATIVE ULTRASOUND BEFORE CESAREAN TO CONFIRM PRESENTATION   egg  retrieval     WISDOM TOOTH EXTRACTION      Medications:  Current Outpatient Medications on File Prior to Visit  Medication Sig   mupirocin  ointment (BACTROBAN ) 2 % Apply 1 Application topically 2 (two) times daily.   No current facility-administered  medications on file prior to visit.    Allergies:  Allergies  Allergen Reactions   Amoxicillin Other (See Comments)    Has patient had a PCN reaction causing immediate rash, facial/tongue/throat swelling, SOB or lightheadedness with hypotension: Unknown Has patient had a PCN reaction causing severe rash involving mucus membranes or skin necrosis: Unknown Has patient had a PCN reaction that required hospitalization: Unknown Has patient had a PCN reaction occurring within the last 10 years: No If all of the above answers are NO, then may proceed with Cephalosporin use.     Social History:  Social History   Socioeconomic History   Marital status: Married    Spouse name: Koren   Number of children: 1   Years of education: Not on file   Highest education level: Bachelor's degree (e.g., BA, AB, BS)  Occupational History   Occupation: Runner, broadcasting/film/video    Comment: Therapist, sports  Tobacco Use   Smoking status: Never   Smokeless tobacco: Never  Vaping Use   Vaping status: Never Used  Substance and Sexual Activity   Alcohol use: Not Currently    Alcohol/week: 1.0 standard drink of alcohol    Types: 1 Glasses of wine per week   Drug use: No   Sexual activity: Yes    Birth control/protection: None    Comment: History of infertility  Other Topics Concern   Not on file  Social History Narrative   Not on file   Social Drivers of Health   Financial Resource Strain: Medium Risk (12/29/2023)   Overall Financial Resource Strain (CARDIA)    Difficulty of Paying Living Expenses: Somewhat hard  Food Insecurity: No Food Insecurity (12/29/2023)   Hunger Vital Sign    Worried About Running Out of Food in the Last Year: Never true    Ran Out of Food in the Last Year: Never true  Transportation Needs: Unmet Transportation Needs (12/29/2023)   PRAPARE - Transportation    Lack of Transportation (Medical): No    Lack of Transportation (Non-Medical): Yes  Physical Activity: Inactive (12/29/2023)    Exercise Vital Sign    Days of Exercise per Week: 0 days    Minutes of Exercise per Session: Not on file  Stress: No  Stress Concern Present (12/29/2023)   [employer] of Occupational Health - Occupational Stress Questionnaire    Feeling of Stress: Only a little  Social Connections: Moderately Integrated (12/29/2023)   Social Connection and Isolation Panel    Frequency of Communication with Friends and Family: Once a week    Frequency of Social Gatherings with Friends and Family: Once a week    Attends Religious Services: More than 4 times per year    Active Member of [employer] or Organizations: Yes    Attends Engineer, structural: More than 4 times per year    Marital Status: Married  Catering manager Violence: Not At Risk (02/05/2023)   Humiliation, Afraid, Rape, and Kick questionnaire    Fear of Current or Ex-Partner: No    Emotionally Abused: No    Physically Abused: No    Sexually Abused: No   Social History   Tobacco Use  Smoking Status Never  Smokeless Tobacco Never   Social History   Substance and Sexual Activity  Alcohol Use Not Currently   Alcohol/week: 1.0 standard drink of alcohol   Types: 1 Glasses of wine per week    Family History:  Family History  Problem Relation Age of Onset   Kidney Stones Mother    Hypertension Mother    Diabetes Mother    Hypertension Father    Kidney Stones Father    Migraines Father    Diabetes Father    Obesity Father    Irritable bowel syndrome Sister    Migraines Sister    Kidney disease Sister    Leukemia Sister    Cancer Sister    Early death Sister    Obesity Sister    Heart disease Maternal Grandmother    Diabetes Maternal Grandmother    Heart failure Maternal Grandmother    Diabetes Maternal Grandfather    Dementia Maternal Grandfather    Non-Hodgkin's lymphoma Paternal Grandfather    Leukemia Paternal Grandfather    Hodgkin's lymphoma Paternal Grandfather    Cancer Paternal Grandfather    Hearing  loss Paternal Grandfather    Leukemia Maternal Uncle    Cancer Maternal Uncle    Early death Maternal Uncle    Cancer Paternal Uncle     Past medical history, surgical history, medications, allergies, family history and social history reviewed with patient today and changes made to appropriate areas of the chart.   ROS All other ROS negative except what is listed above and in the HPI.      Objective:    BP 131/89 (BP Location: Left Arm, Patient Position: Sitting, Cuff Size: Large)   Pulse (!) 111   Temp 98.2 F (36.8 C) (Oral)   Resp 15   Ht 5' 7.99 (1.727 m)   Wt 272 lb (123.4 kg)   LMP 12/14/2023 (Exact Date)   SpO2 98%   BMI 41.37 kg/m   Wt Readings from Last 3 Encounters:  01/02/24 272 lb (123.4 kg)  11/29/23 275 lb (124.7 kg)  11/20/23 274 lb 9.6 oz (124.6 kg)    Physical Exam Vitals and nursing note reviewed. Exam conducted with a chaperone present.  Constitutional:      General: She is awake. She is not in acute distress.    Appearance: She is well-developed and well-groomed. She is obese. She is not ill-appearing or toxic-appearing.  HENT:     Head: Normocephalic and atraumatic.     Right Ear: Hearing, tympanic membrane, ear canal and external ear normal. No drainage.  Left Ear: Hearing, tympanic membrane, ear canal and external ear normal. No drainage.     Nose: Nose normal.     Right Sinus: No maxillary sinus tenderness or frontal sinus tenderness.     Left Sinus: No maxillary sinus tenderness or frontal sinus tenderness.     Mouth/Throat:     Mouth: Mucous membranes are moist.     Pharynx: Oropharynx is clear. Uvula midline. No pharyngeal swelling, oropharyngeal exudate or posterior oropharyngeal erythema.  Eyes:     General: Lids are normal.        Right eye: No discharge.        Left eye: No discharge.     Extraocular Movements: Extraocular movements intact.     Conjunctiva/sclera: Conjunctivae normal.     Pupils: Pupils are equal, round, and  reactive to light.     Visual Fields: Right eye visual fields normal and left eye visual fields normal.  Neck:     Thyroid : No thyromegaly.     Vascular: No carotid bruit.     Trachea: Trachea normal.  Cardiovascular:     Rate and Rhythm: Normal rate and regular rhythm.     Heart sounds: Normal heart sounds. No murmur heard.    No gallop.  Pulmonary:     Effort: Pulmonary effort is normal. No accessory muscle usage or respiratory distress.     Breath sounds: Normal breath sounds.  Chest:  Breasts:    Right: Normal.     Left: Normal.  Abdominal:     General: Bowel sounds are normal.     Palpations: Abdomen is soft. There is no hepatomegaly or splenomegaly.     Tenderness: There is no abdominal tenderness.  Musculoskeletal:        General: Normal range of motion.     Cervical back: Normal range of motion and neck supple.     Right lower leg: No edema.     Left lower leg: No edema.  Lymphadenopathy:     Head:     Right side of head: No submental, submandibular, tonsillar, preauricular or posterior auricular adenopathy.     Left side of head: No submental, submandibular, tonsillar, preauricular or posterior auricular adenopathy.     Cervical: No cervical adenopathy.     Upper Body:     Right upper body: No supraclavicular, axillary or pectoral adenopathy.     Left upper body: No supraclavicular, axillary or pectoral adenopathy.  Skin:    General: Skin is warm and dry.     Capillary Refill: Capillary refill takes less than 2 seconds.     Findings: No rash.  Neurological:     Mental Status: She is alert and oriented to person, place, and time.     Gait: Gait is intact.     Deep Tendon Reflexes: Reflexes are normal and symmetric.     Reflex Scores:      Brachioradialis reflexes are 2+ on the right side and 2+ on the left side.      Patellar reflexes are 2+ on the right side and 2+ on the left side. Psychiatric:        Attention and Perception: Attention normal.        Mood and  Affect: Mood normal.        Speech: Speech normal.        Behavior: Behavior normal. Behavior is cooperative.        Thought Content: Thought content normal.        Judgment:  Judgment normal.     Results for orders placed or performed during the hospital encounter of 11/23/23  CBC with Differential   Collection Time: 11/23/23  9:17 PM  Result Value Ref Range   WBC 12.3 (H) 4.0 - 10.5 K/uL   RBC 4.51 3.87 - 5.11 MIL/uL   Hemoglobin 13.4 12.0 - 15.0 g/dL   HCT 60.3 63.9 - 53.9 %   MCV 87.8 80.0 - 100.0 fL   MCH 29.7 26.0 - 34.0 pg   MCHC 33.8 30.0 - 36.0 g/dL   RDW 87.3 88.4 - 84.4 %   Platelets 346 150 - 400 K/uL   nRBC 0.0 0.0 - 0.2 %   Neutrophils Relative % 52 %   Neutro Abs 6.3 1.7 - 7.7 K/uL   Lymphocytes Relative 37 %   Lymphs Abs 4.6 (H) 0.7 - 4.0 K/uL   Monocytes Relative 8 %   Monocytes Absolute 1.0 0.1 - 1.0 K/uL   Eosinophils Relative 3 %   Eosinophils Absolute 0.3 0.0 - 0.5 K/uL   Basophils Relative 0 %   Basophils Absolute 0.1 0.0 - 0.1 K/uL   Immature Granulocytes 0 %   Abs Immature Granulocytes 0.03 0.00 - 0.07 K/uL      Assessment & Plan:   Problem List Items Addressed This Visit       Endocrine   PCOS (polycystic ovarian syndrome)   Chronic, stable without medications.  Continue this and monitor labs.  CMP, CBC, A1c today.        Relevant Orders   Comprehensive metabolic panel with GFR   TSH   Lipid Panel w/o Chol/HDL Ratio   HgB A1c     Musculoskeletal and Integument   Eczema   Over past months more prominent.  Will trial Triamcinolone  cream to areas as needed.  Recommend changing to products without scent and gentle soap.  Avoid irritants when possible. If worsening return to office.        Other   Obesity - Primary   BMI 41.37.  Recommended eating smaller high protein, low fat meals more frequently and exercising 30 mins a day 5 times a week with a goal of 10-15lb weight loss in the next 3 months. Patient voiced their understanding and  motivation to adhere to these recommendations.       Iron  deficiency anemia   Follows with hematology as needed.  Check labs today and continue current collaboration.      Relevant Orders   CBC with Differential/Platelet   Ferritin   Iron    BMI 40.0-44.9, adult (HCC)   Recommend  focus on healthy diet choices and regular physical activity (30 minutes 5 days a week).  Focus on small goals at a time.       Other Visit Diagnoses       Encounter for annual physical exam       Annual physical today, health maintenance reviewed with patient.        Follow up plan: Return in about 1 year (around 01/01/2025) for Annual Physical.   LABORATORY TESTING:  - Pap smear: up to date  IMMUNIZATIONS:   - Tdap: Tetanus vaccination status reviewed: last tetanus booster within 10 years. - Influenza: Up to date - Pneumovax: Not applicable - Prevnar: Not applicable - COVID: Up to date - HPV: Not applicable - Shingrix vaccine: Not applicable  SCREENING: -Mammogram: Not applicable  - Colonoscopy: Not applicable  - Bone Density: Not applicable  -Hearing Test: Not applicable  -Spirometry: Not  applicable   PATIENT COUNSELING:   Advised to take 1 mg of folate supplement per day if capable of pregnancy.   Sexuality: Discussed sexually transmitted diseases, partner selection, use of condoms, avoidance of unintended pregnancy  and contraceptive alternatives.   Advised to avoid cigarette smoking.  I discussed with the patient that most people either abstain from alcohol or drink within safe limits (<=14/week and <=4 drinks/occasion for males, <=7/weeks and <= 3 drinks/occasion for females) and that the risk for alcohol disorders and other health effects rises proportionally with the number of drinks per week and how often a drinker exceeds daily limits.  Discussed cessation/primary prevention of drug use and availability of treatment for abuse.   Diet: Encouraged to adjust caloric intake to  maintain  or achieve ideal body weight, to reduce intake of dietary saturated fat and total fat, to limit sodium intake by avoiding high sodium foods and not adding table salt, and to maintain adequate dietary potassium and calcium preferably from fresh fruits, vegetables, and low-fat dairy products.    Stressed the importance of regular exercise  Injury prevention: Discussed safety belts, safety helmets, smoke detector, smoking near bedding or upholstery.   Dental health: Discussed importance of regular tooth brushing, flossing, and dental visits.    NEXT PREVENTATIVE PHYSICAL DUE IN 1 YEAR. Return in about 1 year (around 01/01/2025) for Annual Physical.

## 2024-01-02 NOTE — Assessment & Plan Note (Signed)
 Follows with hematology as needed.  Check labs today and continue current collaboration.

## 2024-01-02 NOTE — Assessment & Plan Note (Signed)
 BMI 41.37.  Recommended eating smaller high protein, low fat meals more frequently and exercising 30 mins a day 5 times a week with a goal of 10-15lb weight loss in the next 3 months. Patient voiced their understanding and motivation to adhere to these recommendations.

## 2024-01-02 NOTE — Assessment & Plan Note (Signed)
 Chronic, stable without medications.  Continue this and monitor labs.  CMP, CBC, A1c today.

## 2024-01-03 ENCOUNTER — Ambulatory Visit: Payer: Self-pay | Admitting: Nurse Practitioner

## 2024-01-03 LAB — COMPREHENSIVE METABOLIC PANEL WITH GFR
ALT: 28 IU/L (ref 0–32)
AST: 20 IU/L (ref 0–40)
Albumin: 4.5 g/dL (ref 3.9–4.9)
Alkaline Phosphatase: 91 IU/L (ref 41–116)
BUN/Creatinine Ratio: 17 (ref 9–23)
BUN: 11 mg/dL (ref 6–20)
Bilirubin Total: 0.3 mg/dL (ref 0.0–1.2)
CO2: 21 mmol/L (ref 20–29)
Calcium: 9.1 mg/dL (ref 8.7–10.2)
Chloride: 101 mmol/L (ref 96–106)
Creatinine, Ser: 0.64 mg/dL (ref 0.57–1.00)
Globulin, Total: 2.8 g/dL (ref 1.5–4.5)
Glucose: 103 mg/dL — ABNORMAL HIGH (ref 70–99)
Potassium: 3.9 mmol/L (ref 3.5–5.2)
Sodium: 138 mmol/L (ref 134–144)
Total Protein: 7.3 g/dL (ref 6.0–8.5)
eGFR: 116 mL/min/1.73 (ref >59)

## 2024-01-03 LAB — CBC WITH DIFFERENTIAL/PLATELET
Basophils Absolute: 0 x10E3/uL (ref 0.0–0.2)
Basos: 0 %
EOS (ABSOLUTE): 0.2 x10E3/uL (ref 0.0–0.4)
Eos: 2 %
Hematocrit: 43 % (ref 34.0–46.6)
Hemoglobin: 13.9 g/dL (ref 11.1–15.9)
Immature Grans (Abs): 0 x10E3/uL (ref 0.0–0.1)
Immature Granulocytes: 0 %
Lymphocytes Absolute: 2.9 x10E3/uL (ref 0.7–3.1)
Lymphs: 28 %
MCH: 29.4 pg (ref 26.6–33.0)
MCHC: 32.3 g/dL (ref 31.5–35.7)
MCV: 91 fL (ref 79–97)
Monocytes Absolute: 0.6 x10E3/uL (ref 0.1–0.9)
Monocytes: 5 %
Neutrophils Absolute: 6.8 x10E3/uL (ref 1.4–7.0)
Neutrophils: 65 %
Platelets: 316 x10E3/uL (ref 150–450)
RBC: 4.73 x10E6/uL (ref 3.77–5.28)
RDW: 12.7 % (ref 11.7–15.4)
WBC: 10.5 x10E3/uL (ref 3.4–10.8)

## 2024-01-03 LAB — LIPID PANEL W/O CHOL/HDL RATIO
Cholesterol, Total: 159 mg/dL (ref 100–199)
HDL: 55 mg/dL (ref >39)
LDL Chol Calc (NIH): 83 mg/dL (ref 0–99)
Triglycerides: 115 mg/dL (ref 0–149)
VLDL Cholesterol Cal: 21 mg/dL (ref 5–40)

## 2024-01-03 LAB — HEMOGLOBIN A1C
Est. average glucose Bld gHb Est-mCnc: 103 mg/dL
Hgb A1c MFr Bld: 5.2 % (ref 4.8–5.6)

## 2024-01-03 LAB — FERRITIN: Ferritin: 124 ng/mL (ref 15–150)

## 2024-01-03 LAB — IRON: Iron: 68 ug/dL (ref 27–159)

## 2024-01-03 LAB — TSH: TSH: 0.639 u[IU]/mL (ref 0.450–4.500)

## 2024-01-03 NOTE — Progress Notes (Signed)
 Contacted via MyChart  Good afternoon Mary Bradford, your labs have returned and overall are stable. No changes needed.  Great job!!  Any questions? Keep being amazing!!  Thank you for allowing me to participate in your care.  I appreciate you. Kindest regards, Janes Colegrove

## 2024-01-18 ENCOUNTER — Inpatient Hospital Stay: Attending: Oncology

## 2024-01-18 DIAGNOSIS — Z79899 Other long term (current) drug therapy: Secondary | ICD-10-CM | POA: Insufficient documentation

## 2024-01-18 DIAGNOSIS — D509 Iron deficiency anemia, unspecified: Secondary | ICD-10-CM | POA: Diagnosis present

## 2024-01-18 LAB — CBC WITH DIFFERENTIAL (CANCER CENTER ONLY)
Abs Immature Granulocytes: 0.05 K/uL (ref 0.00–0.07)
Basophils Absolute: 0 K/uL (ref 0.0–0.1)
Basophils Relative: 0 %
Eosinophils Absolute: 0.2 K/uL (ref 0.0–0.5)
Eosinophils Relative: 2 %
HCT: 38.5 % (ref 36.0–46.0)
Hemoglobin: 13.2 g/dL (ref 12.0–15.0)
Immature Granulocytes: 0 %
Lymphocytes Relative: 30 %
Lymphs Abs: 3.6 K/uL (ref 0.7–4.0)
MCH: 29.9 pg (ref 26.0–34.0)
MCHC: 34.3 g/dL (ref 30.0–36.0)
MCV: 87.3 fL (ref 80.0–100.0)
Monocytes Absolute: 0.7 K/uL (ref 0.1–1.0)
Monocytes Relative: 6 %
Neutro Abs: 7.6 K/uL (ref 1.7–7.7)
Neutrophils Relative %: 62 %
Platelet Count: 309 K/uL (ref 150–400)
RBC: 4.41 MIL/uL (ref 3.87–5.11)
RDW: 12.4 % (ref 11.5–15.5)
WBC Count: 12.2 K/uL — ABNORMAL HIGH (ref 4.0–10.5)
nRBC: 0 % (ref 0.0–0.2)

## 2024-01-18 LAB — IRON AND TIBC
Iron: 41 ug/dL (ref 28–170)
Saturation Ratios: 11 % (ref 10.4–31.8)
TIBC: 358 ug/dL (ref 250–450)
UIBC: 317 ug/dL

## 2024-01-18 LAB — FERRITIN: Ferritin: 66 ng/mL (ref 11–307)

## 2024-01-18 LAB — VITAMIN B12: Vitamin B-12: 235 pg/mL (ref 180–914)

## 2024-01-20 ENCOUNTER — Ambulatory Visit: Payer: Self-pay | Admitting: Oncology

## 2024-01-22 NOTE — Telephone Encounter (Signed)
-----   Message from Annah JAYSON Skene sent at 01/20/2024  2:34 PM EDT ----- B12 is low <300. Is she on po b12?  ----- Message ----- From: Interface, [facility] In [place] Sent: 01/18/2024   3:35 PM EDT To: Annah JAYSON Skene, MD

## 2024-01-22 NOTE — Telephone Encounter (Signed)
 Per Dr. Melanee B12 is low <300. Is she on po b12?Mary Bradford  Last B12 on 10/24 was 235 [phone]).  Outbound call; voice message left.

## 2024-01-29 ENCOUNTER — Encounter: Payer: Self-pay | Admitting: Oncology

## 2024-01-29 NOTE — Telephone Encounter (Signed)
 yes she can take over-the-counter 1000 mcg p.o. daily

## 2024-02-09 ENCOUNTER — Encounter: Payer: Self-pay | Admitting: Intensive Care

## 2024-02-09 ENCOUNTER — Emergency Department
Admission: EM | Admit: 2024-02-09 | Discharge: 2024-02-09 | Disposition: A | Discharge status: Home / Self Care | Attending: Emergency Medicine | Admitting: Emergency Medicine

## 2024-02-09 ENCOUNTER — Other Ambulatory Visit: Payer: Self-pay

## 2024-02-09 DIAGNOSIS — R220 Localized swelling, mass and lump, head: Secondary | ICD-10-CM | POA: Diagnosis present

## 2024-02-09 DIAGNOSIS — L739 Follicular disorder, unspecified: Secondary | ICD-10-CM | POA: Diagnosis not present

## 2024-02-09 DIAGNOSIS — L03811 Cellulitis of head [any part, except face]: Secondary | ICD-10-CM | POA: Insufficient documentation

## 2024-02-09 MED ORDER — CEPHALEXIN 500 MG PO CAPS: 500.0000 mg | ORAL_CAPSULE | Freq: Four times a day (QID) | ORAL | 0 refills | Status: AC

## 2024-02-09 MED ORDER — DOXYCYCLINE MONOHYDRATE 100 MG PO TABS: 100.0000 mg | ORAL_TABLET | Freq: Two times a day (BID) | ORAL | 0 refills | Status: AC

## 2024-02-09 NOTE — Discharge Instructions (Addendum)
Please take the antibiotics as prescribed.  Please return for any new, worsening, or changing symptoms or other concerns.  It was a pleasure caring for you today.

## 2024-02-09 NOTE — ED Triage Notes (Signed)
 Patient presents with abscess on lower back of head. Reports little drainage.

## 2024-02-09 NOTE — ED Provider Notes (Signed)
 [facility] Provider Note    Event Date/Time   First MD Initiated Contact with Patient 02/09/24 [phone]     (approximate)   History   Abscess   HPI  Mary Bradford is a 38 y.o. female who presents today for evaluation of folliculitis.  Patient reports that she has had this multiple times in the past in different spots of her body.  She reports that she noticed a lesion to the back of her head.  No drainage.  She reports that it is tender to touch.  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   History of depression 02/03/2021   Rh negative state in antepartum period 08/26/2020   Family history of AML (acute myeloid leukemia) 04/04/2019   Migraines 03/07/2019   BMI 40.0-44.9, adult (HCC) 03/07/2019   Obesity 03/07/2019   PCOS (polycystic ovarian syndrome) 08/04/2014          Physical Exam   Triage Vital Signs: ED Triage Vitals  Encounter Vitals Group     BP 02/09/24 0931 (!) 153/95     Girls Systolic BP Percentile --      Girls Diastolic BP Percentile --      Boys Systolic BP Percentile --      Boys Diastolic BP Percentile --      Pulse Rate 02/09/24 0931 (!) 120     Resp 02/09/24 0931 16     Temp 02/09/24 0931 97.8 F (36.6 C)     Temp Source 02/09/24 0931 Oral     SpO2 02/09/24 0931 100 %     Weight 02/09/24 0932 270 lb (122.5 kg)     Height 02/09/24 0932 5' 8 (1.727 m)     Head Circumference --      Peak Flow --      Pain Score 02/09/24 0932 3     Pain Loc --      Pain Education --      Exclude from Growth Chart --     Most recent vital signs: Vitals:   02/09/24 0931 02/09/24 1034  BP: (!) 153/95   Pulse: (!) 120 (!) 102  Resp: 16   Temp: 97.8 F (36.6 C)   SpO2: 100%     Physical Exam Vitals and nursing note reviewed.  Constitutional:      General: Awake and alert. No acute distress.    Appearance: Normal  appearance. The patient is normal weight.  HENT:     Head: Normocephalic and atraumatic.     Mouth: Mucous membranes are moist.  Eyes:     General: PERRL. Normal EOMs        Right eye: No discharge.        Left eye: No discharge.     Conjunctiva/sclera: Conjunctivae normal.  Cardiovascular:     Rate and Rhythm: Normal rate and regular rhythm.     Pulses: Normal pulses.  Pulmonary:     Effort: Pulmonary effort is normal. No respiratory distress.     Breath sounds: Normal breath sounds.  Abdominal:     Abdomen is soft. There is no abdominal tenderness. No rebound or guarding. No distention. Musculoskeletal:        General: No swelling. Normal range of motion.     Cervical back: Normal range of motion and neck supple.  Skin:    General: Skin is warm and dry.  Capillary Refill: Capillary refill takes less than 2 seconds.     Findings: 3 x 3 cm area of erythema to the back of the head with what appears to be an inflamed hair follicle.  No fluctuance.  The area is indurated.  There is no drainage. Neurological:     Mental Status: The patient is awake and alert.      ED Results / Procedures / Treatments   Labs (all labs ordered are listed, but only abnormal results are displayed) Labs Reviewed - No data to display   EKG     RADIOLOGY     PROCEDURES:  Critical Care performed:   Procedures   MEDICATIONS ORDERED IN ED: Medications - No data to display   IMPRESSION / MDM / ASSESSMENT AND PLAN / ED COURSE  I reviewed the triage vital signs and the nursing notes.   Differential diagnosis includes, but is not limited to, cellulitis, folliculitis, abscess, furuncle, carbuncle.  Patient is awake and alert, nontoxic in appearance.  She has an area of erythema with induration to the back of her head, no fluctuance or drainage, no evidence of abscess at this time.  Will treat with oral antibiotics.  We discussed return precautions and outpatient follow-up.  Patient  understands and agrees with plan.  Discharged in stable condition.   Patient's presentation is most consistent with acute complicated illness / injury requiring diagnostic workup.   FINAL CLINICAL IMPRESSION(S) / ED DIAGNOSES   Final diagnoses:  Folliculitis  Cellulitis of head except face     Rx / DC Orders   ED Discharge Orders          Ordered    cephALEXin (KEFLEX) 500 MG capsule  4 times daily        02/09/24 1025    doxycycline  (ADOXA) 100 MG tablet  2 times daily        02/09/24 1025             Note:  This document was prepared using Dragon voice recognition software and may include unintentional dictation errors.   Govanni Plemons E, PA-C 02/09/24 1415    Ernest Ronal BRAVO, MD 02/10/24 [phone]

## 2024-02-10 ENCOUNTER — Other Ambulatory Visit: Payer: Self-pay

## 2024-02-10 ENCOUNTER — Emergency Department
Admission: EM | Admit: 2024-02-10 | Discharge: 2024-02-10 | Disposition: A | Discharge status: Home / Self Care | Attending: Emergency Medicine | Admitting: Emergency Medicine

## 2024-02-10 DIAGNOSIS — L02811 Cutaneous abscess of head [any part, except face]: Secondary | ICD-10-CM | POA: Insufficient documentation

## 2024-02-10 DIAGNOSIS — L0291 Cutaneous abscess, unspecified: Secondary | ICD-10-CM

## 2024-02-10 LAB — CBC WITH DIFFERENTIAL/PLATELET
Abs Immature Granulocytes: 0.07 K/uL (ref 0.00–0.07)
Basophils Absolute: 0.1 K/uL (ref 0.0–0.1)
Basophils Relative: 0 %
Eosinophils Absolute: 0.3 K/uL (ref 0.0–0.5)
Eosinophils Relative: 2 %
HCT: 41.1 % (ref 36.0–46.0)
Hemoglobin: 13.9 g/dL (ref 12.0–15.0)
Immature Granulocytes: 0 %
Lymphocytes Relative: 20 %
Lymphs Abs: 3.2 K/uL (ref 0.7–4.0)
MCH: 29.5 pg (ref 26.0–34.0)
MCHC: 33.8 g/dL (ref 30.0–36.0)
MCV: 87.3 fL (ref 80.0–100.0)
Monocytes Absolute: 1.4 K/uL — ABNORMAL HIGH (ref 0.1–1.0)
Monocytes Relative: 9 %
Neutro Abs: 11.1 K/uL — ABNORMAL HIGH (ref 1.7–7.7)
Neutrophils Relative %: 69 %
Platelets: 362 K/uL (ref 150–400)
RBC: 4.71 MIL/uL (ref 3.87–5.11)
RDW: 12.3 % (ref 11.5–15.5)
WBC: 16.2 K/uL — ABNORMAL HIGH (ref 4.0–10.5)
nRBC: 0 % (ref 0.0–0.2)

## 2024-02-10 LAB — BASIC METABOLIC PANEL WITH GFR
Anion gap: 9 (ref 5–15)
BUN: 7 mg/dL (ref 6–20)
CO2: 26 mmol/L (ref 22–32)
Calcium: 8.8 mg/dL — ABNORMAL LOW (ref 8.9–10.3)
Chloride: 103 mmol/L (ref 98–111)
Creatinine, Ser: 0.59 mg/dL (ref 0.44–1.00)
GFR, Estimated: >60 mL/min (ref >60)
Glucose, Bld: 86 mg/dL (ref 70–99)
Potassium: 3.9 mmol/L (ref 3.5–5.1)
Sodium: 138 mmol/L (ref 135–145)

## 2024-02-10 MED ORDER — OXYCODONE HCL 5 MG PO TABS: 5.0000 mg | ORAL_TABLET | Freq: Three times a day (TID) | ORAL | 0 refills | Status: AC | PRN

## 2024-02-10 MED ORDER — CLINDAMYCIN PHOSPHATE 600 MG/50ML IV SOLN
600.0000 mg | Freq: Once | INTRAVENOUS | Status: AC
  Administered 2024-02-10: 600 mg via INTRAVENOUS
  Filled 2024-02-10: qty 50

## 2024-02-10 MED ORDER — KETOROLAC TROMETHAMINE 15 MG/ML IJ SOLN
15.0000 mg | Freq: Once | INTRAMUSCULAR | Status: AC
  Administered 2024-02-10: 15 mg via INTRAVENOUS
  Filled 2024-02-10: qty 1

## 2024-02-10 MED ORDER — LIDOCAINE-EPINEPHRINE-TETRACAINE (LET) TOPICAL GEL
3.0000 mL | Freq: Once | TOPICAL | Status: AC
Start: 2024-02-10 — End: 2024-02-10
  Administered 2024-02-10: 3 mL via TOPICAL
  Filled 2024-02-10: qty 3

## 2024-02-10 MED ORDER — CHLORHEXIDINE GLUCONATE 4 % EX SOLN: Freq: Every day | CUTANEOUS | 2 refills | Status: AC | PRN

## 2024-02-10 NOTE — ED Provider Notes (Signed)
 [facility] Provider Note    Event Date/Time   First MD Initiated Contact with Patient 02/10/24 1134     (approximate)   History   Abscess   HPI  Mary Bradford is a 38 y.o. female history PCOS, migraines, anemia and anxiety presents emergency department with increasing pain for abscess to the scalp.  Patient was seen yesterday.  Given doxycycline  and Keflex.  Has taken the medication but states is worsening.  Denies fever or chills.  States she is painful to lean back and sleep.      Physical Exam   Triage Vital Signs: ED Triage Vitals  Encounter Vitals Group     BP 02/10/24 1124 (!) 158/100     Girls Systolic BP Percentile --      Girls Diastolic BP Percentile --      Boys Systolic BP Percentile --      Boys Diastolic BP Percentile --      Pulse Rate 02/10/24 1124 (!) 110     Resp 02/10/24 1124 16     Temp 02/10/24 1124 97.8 F (36.6 C)     Temp Source 02/10/24 1124 Oral     SpO2 02/10/24 1124 100 %     Weight 02/10/24 1123 270 lb (122.5 kg)     Height 02/10/24 1123 5' 8 (1.727 m)     Head Circumference --      Peak Flow --      Pain Score 02/10/24 1120 7     Pain Loc --      Pain Education --      Exclude from Growth Chart --     Most recent vital signs: Vitals:   02/10/24 1124 02/10/24 1300  BP: (!) 158/100 (!) 134/90  Pulse: (!) 110 88  Resp: 16 16  Temp: 97.8 F (36.6 C)   SpO2: 100% 100%     General: Awake, no distress.   CV:  Good peripheral perfusion.  Resp:  Normal effort.  Abd:  No distention.   Other:  Skin with 6 cm to 7 cm round lesion on posterior scalp, scab noted around the hair follicles in the center, area is very tender to palpation, no fluctuance noted   ED Results / Procedures / Treatments   Labs (all labs ordered are listed, but only abnormal results are displayed) Labs Reviewed  BASIC METABOLIC PANEL WITH GFR - Abnormal; Notable for the following components:      Result Value   Calcium 8.8  (*)    All other components within normal limits  CBC WITH DIFFERENTIAL/PLATELET - Abnormal; Notable for the following components:   WBC 16.2 (*)    Neutro Abs 11.1 (*)    Monocytes Absolute 1.4 (*)    All other components within normal limits     EKG     RADIOLOGY     PROCEDURES:   Procedures  Critical Care:  no Chief Complaint  Patient presents with   Abscess      MEDICATIONS ORDERED IN ED: Medications  clindamycin  (CLEOCIN ) IVPB 600 mg (0 mg Intravenous Stopped 02/10/24 1300)  lidocaine -EPINEPHrine -tetracaine (LET) topical gel (3 mLs Topical Given 02/10/24 1202)  ketorolac  (TORADOL ) 15 MG/ML injection 15 mg (15 mg Intravenous Given 02/10/24 1225)     IMPRESSION / MDM / ASSESSMENT AND PLAN / ED COURSE  I reviewed the triage vital signs and the nursing notes.  Differential diagnosis includes, but is not limited to, cellulitis, abscess, fungal infection, MRSA  Patient's presentation is most consistent with acute illness / injury with system symptoms.   Medications given: Clindamycin  600 mg IV, Toradol   L ET applied to the scalp   I was able to debride the area to remove the scab, no pus noted, area is still hard and indurated but not fluctuant.  Patient was given Toradol  15 mg IV for pain, clindamycin  600 mg IV for infection.  Several family members have had same symptoms.  Feel he may have a staph infection.  Discussed cleaning the house, bathing with Hibiclens, once a week to use 1/4 cup of bleach and a full tub of water and soaking it to kill staff.  Follow-up with surgery considering the lesion on her scalp.  Return if worsening.  Patient is in agreement treatment plan.  She is to continue the Keflex and doxycycline  that was prescribed yesterday.  Prescription for Hibiclens and oxycodone  also given today.  Discharged in stable condition.   FINAL CLINICAL IMPRESSION(S) / ED DIAGNOSES   Final diagnoses:  Abscess     Rx /  DC Orders   ED Discharge Orders          Ordered    oxyCODONE  (ROXICODONE ) 5 MG immediate release tablet  Every 8 hours PRN        02/10/24 1250    chlorhexidine (HIBICLENS) 4 % external liquid  Daily PRN        02/10/24 1250             Note:  This document was prepared using Dragon voice recognition software and may include unintentional dictation errors.    Gasper Devere ORN, PA-C 02/10/24 1421    Waymond Lorelle Cummins, MD 02/10/24 [phone]

## 2024-02-10 NOTE — ED Triage Notes (Signed)
 Pt to ED for abscess to posterior head since 1 week. Seen here for same yesterday. States not getting better and has started abx.

## 2024-02-10 NOTE — Discharge Instructions (Signed)
 Call for an appointment with surgery.  Tell them you were seen in the emergency department and we wanted them to evaluate the abscess.  Also concern that this could be a fungal type infection so if you are not improving with antibiotics we will need to change medications.  It will take oral antibiotics 3 days to get your system.  Hopefully the IV antibiotics and unroofing of the abscess will help with healing.  Use the Hibiclens daily.  Since the entire family has had several of these lesions I would advise taking a bath with a full tub of water and 1/4 cup of bleach once every week.  Also wipe all hard surfaces down in your house with a bleach type product.

## 2024-02-13 ENCOUNTER — Encounter: Payer: Self-pay | Admitting: Nurse Practitioner

## 2024-02-13 ENCOUNTER — Ambulatory Visit: Admitting: Nurse Practitioner

## 2024-02-13 VITALS — BP 135/82 | HR 80 | Temp 97.8°F | Ht 67.99 in | Wt 273.8 lb

## 2024-02-13 DIAGNOSIS — L0291 Cutaneous abscess, unspecified: Secondary | ICD-10-CM

## 2024-02-13 NOTE — Progress Notes (Signed)
 BP 135/82 (BP Location: Right Arm, Patient Position: Sitting, Cuff Size: Large)   Pulse 80   Temp 97.8 F (36.6 C) (Oral)   Ht 5' 7.99 (1.727 m)   Wt 273 lb 12.8 oz (124.2 kg)   LMP 02/01/2024 (Exact Date)   SpO2 99%   BMI 41.64 kg/m    Subjective:    Patient ID: Mary Bradford, female    DOB: 1985/09/12, 38 y.o.   MRN: [id number]  HPI: Mary Bradford is a 38 y.o. female  Chief Complaint  Patient presents with   Neck Pain    No pain, plenty of drainage since yesterday, patient is on antibiotics since Saturday and ibuprofen , and hydrocodone  for sleep.    Patient states she was seen in the ER on 11/15 for an abscess on her neck.  States she felt like she has a pimple on the back of her neck that was swollen and red.  She was treated with keflex  and doxycyline and sent home.  Ended up back in the ER on 11/16 due to the pain and was given antibiotics.  She was given antibiotics IV.  Went back to the ER yesterday and they opened up the abscess and packed it yesterday.  She was given IV clindamycin  in the ER.  She was told to continue with the Keflex  and the Doxycycline .  They packed the area and told her to follow up with PCP.    Relevant past medical, surgical, family and social history reviewed and updated as indicated. Interim medical history since our last visit reviewed. Allergies and medications reviewed and updated.  Review of Systems  Skin:        Abscess on back of head    Per HPI unless specifically indicated above     Objective:    BP 135/82 (BP Location: Right Arm, Patient Position: Sitting, Cuff Size: Large)   Pulse 80   Temp 97.8 F (36.6 C) (Oral)   Ht 5' 7.99 (1.727 m)   Wt 273 lb 12.8 oz (124.2 kg)   LMP 02/01/2024 (Exact Date)   SpO2 99%   BMI 41.64 kg/m   Wt Readings from Last 3 Encounters:  02/13/24 273 lb 12.8 oz (124.2 kg)  02/10/24 270 lb (122.5 kg)  02/09/24 270 lb (122.5 kg)    Physical Exam Vitals and nursing note  reviewed.  Constitutional:      General: She is not in acute distress.    Appearance: Normal appearance. She is normal weight. She is not ill-appearing, toxic-appearing or diaphoretic.  HENT:     Head: Normocephalic.     Right Ear: External ear normal.     Left Ear: External ear normal.     Nose: Nose normal.     Mouth/Throat:     Mouth: Mucous membranes are moist.     Pharynx: Oropharynx is clear.  Eyes:     General:        Right eye: No discharge.        Left eye: No discharge.     Extraocular Movements: Extraocular movements intact.     Conjunctiva/sclera: Conjunctivae normal.     Pupils: Pupils are equal, round, and reactive to light.  Cardiovascular:     Rate and Rhythm: Normal rate and regular rhythm.     Heart sounds: No murmur heard. Pulmonary:     Effort: Pulmonary effort is normal. No respiratory distress.     Breath sounds: Normal breath sounds. No wheezing or rales.  Musculoskeletal:  Cervical back: Normal range of motion and neck supple.  Skin:    General: Skin is warm and dry.     Capillary Refill: Capillary refill takes less than 2 seconds.      Neurological:     General: No focal deficit present.     Mental Status: She is alert and oriented to person, place, and time. Mental status is at baseline.  Psychiatric:        Mood and Affect: Mood normal.        Behavior: Behavior normal.        Thought Content: Thought content normal.        Judgment: Judgment normal.     Results for orders placed or performed during the hospital encounter of 02/10/24  Basic metabolic panel   Collection Time: 02/10/24 11:57 AM  Result Value Ref Range   Sodium 138 135 - 145 mmol/L   Potassium 3.9 3.5 - 5.1 mmol/L   Chloride 103 98 - 111 mmol/L   CO2 26 22 - 32 mmol/L   Glucose, Bld 86 70 - 99 mg/dL   BUN 7 6 - 20 mg/dL   Creatinine, Ser 9.40 0.44 - 1.00 mg/dL   Calcium 8.8 (L) 8.9 - 10.3 mg/dL   GFR, Estimated >39 >39 mL/min   Anion gap 9 5 - 15  CBC with  Differential   Collection Time: 02/10/24 11:57 AM  Result Value Ref Range   WBC 16.2 (H) 4.0 - 10.5 K/uL   RBC 4.71 3.87 - 5.11 MIL/uL   Hemoglobin 13.9 12.0 - 15.0 g/dL   HCT 58.8 63.9 - 53.9 %   MCV 87.3 80.0 - 100.0 fL   MCH 29.5 26.0 - 34.0 pg   MCHC 33.8 30.0 - 36.0 g/dL   RDW 87.6 88.4 - 84.4 %   Platelets 362 150 - 400 K/uL   nRBC 0.0 0.0 - 0.2 %   Neutrophils Relative % 69 %   Neutro Abs 11.1 (H) 1.7 - 7.7 K/uL   Lymphocytes Relative 20 %   Lymphs Abs 3.2 0.7 - 4.0 K/uL   Monocytes Relative 9 %   Monocytes Absolute 1.4 (H) 0.1 - 1.0 K/uL   Eosinophils Relative 2 %   Eosinophils Absolute 0.3 0.0 - 0.5 K/uL   Basophils Relative 0 %   Basophils Absolute 0.1 0.0 - 0.1 K/uL   Immature Granulocytes 0 %   Abs Immature Granulocytes 0.07 0.00 - 0.07 K/uL      Assessment & Plan:   Problem List Items Addressed This Visit   None    Follow up plan: No follow-ups on file.

## 2024-02-15 ENCOUNTER — Ambulatory Visit: Admitting: Nurse Practitioner

## 2024-02-15 ENCOUNTER — Encounter: Payer: Self-pay | Admitting: Nurse Practitioner

## 2024-02-15 VITALS — BP 127/89 | HR 86 | Temp 97.4°F | Ht 67.99 in | Wt 273.0 lb

## 2024-02-15 DIAGNOSIS — L0291 Cutaneous abscess, unspecified: Secondary | ICD-10-CM

## 2024-02-15 DIAGNOSIS — Z23 Encounter for immunization: Secondary | ICD-10-CM

## 2024-02-15 NOTE — Addendum Note (Signed)
 Addended by: MELVIN PAO on: 02/15/2024 12:00 PM   Modules accepted: Orders

## 2024-02-15 NOTE — Progress Notes (Signed)
 BP 127/89 (BP Location: Left Arm, Cuff Size: Large)   Pulse 86   Temp (!) 97.4 F (36.3 C) (Oral)   Ht 5' 7.99 (1.727 m)   Wt 273 lb (123.8 kg)   LMP 02/01/2024 (Exact Date)   SpO2 97%   BMI 41.52 kg/m    Subjective:    Patient ID: Mary Bradford, female    DOB: 1985-06-25, 38 y.o.   MRN: [id number]  HPI: Mary Bradford is a 38 y.o. female  Chief Complaint  Patient presents with   Abscess    F/u. No issues to report   Patient states she was seen in the ER on 11/15 for an abscess on her neck.  States she felt like she has a pimple on the back of her neck that was swollen and red.  She was treated with keflex  and doxycyline and sent home.  Ended up back in the ER on 11/16 due to the pain and was given antibiotics.  She was given antibiotics IV.  Went back to the ER yesterday and they opened up the abscess and packed it yesterday.  She was given IV clindamycin  in the ER.  She was told to continue with the Keflex  and the Doxycycline .  They packed the area and told her to follow up with PCP.   Patient will complete her antibiotics today.  Feels like the pain is getting better.  Here today to have the packing removed.    Relevant past medical, surgical, family and social history reviewed and updated as indicated. Interim medical history since our last visit reviewed. Allergies and medications reviewed and updated.  Review of Systems  Skin:        Abscess on back of head    Per HPI unless specifically indicated above     Objective:    BP 127/89 (BP Location: Left Arm, Cuff Size: Large)   Pulse 86   Temp (!) 97.4 F (36.3 C) (Oral)   Ht 5' 7.99 (1.727 m)   Wt 273 lb (123.8 kg)   LMP 02/01/2024 (Exact Date)   SpO2 97%   BMI 41.52 kg/m   Wt Readings from Last 3 Encounters:  02/15/24 273 lb (123.8 kg)  02/13/24 273 lb 12.8 oz (124.2 kg)  02/10/24 270 lb (122.5 kg)    Physical Exam Vitals and nursing note reviewed.  Constitutional:      General: She  is not in acute distress.    Appearance: Normal appearance. She is normal weight. She is not ill-appearing, toxic-appearing or diaphoretic.  HENT:     Head: Normocephalic.     Right Ear: External ear normal.     Left Ear: External ear normal.     Nose: Nose normal.     Mouth/Throat:     Mouth: Mucous membranes are moist.     Pharynx: Oropharynx is clear.  Eyes:     General:        Right eye: No discharge.        Left eye: No discharge.     Extraocular Movements: Extraocular movements intact.     Conjunctiva/sclera: Conjunctivae normal.     Pupils: Pupils are equal, round, and reactive to light.  Cardiovascular:     Rate and Rhythm: Normal rate and regular rhythm.     Heart sounds: No murmur heard. Pulmonary:     Effort: Pulmonary effort is normal. No respiratory distress.     Breath sounds: Normal breath sounds. No wheezing or rales.  Musculoskeletal:  Cervical back: Normal range of motion and neck supple.  Skin:    General: Skin is warm and dry.     Capillary Refill: Capillary refill takes less than 2 seconds.      Neurological:     General: No focal deficit present.     Mental Status: She is alert and oriented to person, place, and time. Mental status is at baseline.  Psychiatric:        Mood and Affect: Mood normal.        Behavior: Behavior normal.        Thought Content: Thought content normal.        Judgment: Judgment normal.     Results for orders placed or performed during the hospital encounter of 02/10/24  Basic metabolic panel   Collection Time: 02/10/24 11:57 AM  Result Value Ref Range   Sodium 138 135 - 145 mmol/L   Potassium 3.9 3.5 - 5.1 mmol/L   Chloride 103 98 - 111 mmol/L   CO2 26 22 - 32 mmol/L   Glucose, Bld 86 70 - 99 mg/dL   BUN 7 6 - 20 mg/dL   Creatinine, Ser 9.40 0.44 - 1.00 mg/dL   Calcium 8.8 (L) 8.9 - 10.3 mg/dL   GFR, Estimated >39 >39 mL/min   Anion gap 9 5 - 15  CBC with Differential   Collection Time: 02/10/24 11:57 AM   Result Value Ref Range   WBC 16.2 (H) 4.0 - 10.5 K/uL   RBC 4.71 3.87 - 5.11 MIL/uL   Hemoglobin 13.9 12.0 - 15.0 g/dL   HCT 58.8 63.9 - 53.9 %   MCV 87.3 80.0 - 100.0 fL   MCH 29.5 26.0 - 34.0 pg   MCHC 33.8 30.0 - 36.0 g/dL   RDW 87.6 88.4 - 84.4 %   Platelets 362 150 - 400 K/uL   nRBC 0.0 0.0 - 0.2 %   Neutrophils Relative % 69 %   Neutro Abs 11.1 (H) 1.7 - 7.7 K/uL   Lymphocytes Relative 20 %   Lymphs Abs 3.2 0.7 - 4.0 K/uL   Monocytes Relative 9 %   Monocytes Absolute 1.4 (H) 0.1 - 1.0 K/uL   Eosinophils Relative 2 %   Eosinophils Absolute 0.3 0.0 - 0.5 K/uL   Basophils Relative 0 %   Basophils Absolute 0.1 0.0 - 0.1 K/uL   Immature Granulocytes 0 %   Abs Immature Granulocytes 0.07 0.00 - 0.07 K/uL      Assessment & Plan:   Problem List Items Addressed This Visit   None Visit Diagnoses       Abscess    -  Primary   Improved from prior.  Packing removed and replaced today.  Completes antibiotics today.  Follow up in 3 days for reevaluation.     Need for COVID-19 vaccine       Relevant Orders   Pfizer Comirnaty Covid -19 Vaccine 28yrs and older (Completed)     Sore throat       Relevant Orders   Rapid Strep screen([facility])        Follow up plan: Return in about 3 days (around 02/18/2024) for 340 on Monday- okay to double book.

## 2024-02-18 ENCOUNTER — Encounter: Payer: Self-pay | Admitting: Nurse Practitioner

## 2024-02-18 ENCOUNTER — Ambulatory Visit: Admitting: Nurse Practitioner

## 2024-02-18 VITALS — BP 126/82 | HR 91 | Temp 98.0°F | Ht 67.99 in | Wt 272.4 lb

## 2024-02-18 DIAGNOSIS — L0291 Cutaneous abscess, unspecified: Secondary | ICD-10-CM

## 2024-02-18 NOTE — Progress Notes (Signed)
 BP 126/82 (BP Location: Right Arm, Patient Position: Sitting, Cuff Size: Large)   Pulse 91   Temp 98 F (36.7 C) (Oral)   Ht 5' 7.99 (1.727 m)   Wt 272 lb 6.4 oz (123.6 kg)   LMP 02/01/2024 (Exact Date)   SpO2 98%   BMI 41.43 kg/m    Subjective:    Patient ID: Mary Bradford, female    DOB: 14-Nov-1985, 38 y.o.   MRN: [id number]  HPI: Mary Bradford is a 38 y.o. female  Chief Complaint  Patient presents with   office visit   Patient has completed her course of antibiotics.  Feels like the pain is getting better.  Here today to have the packing removed. Denies any drainage or abnormal smell.    Relevant past medical, surgical, family and social history reviewed and updated as indicated. Interim medical history since our last visit reviewed. Allergies and medications reviewed and updated.  Review of Systems  Skin:        Abscess on back of head    Per HPI unless specifically indicated above     Objective:    BP 126/82 (BP Location: Right Arm, Patient Position: Sitting, Cuff Size: Large)   Pulse 91   Temp 98 F (36.7 C) (Oral)   Ht 5' 7.99 (1.727 m)   Wt 272 lb 6.4 oz (123.6 kg)   LMP 02/01/2024 (Exact Date)   SpO2 98%   BMI 41.43 kg/m   Wt Readings from Last 3 Encounters:  02/18/24 272 lb 6.4 oz (123.6 kg)  02/15/24 273 lb (123.8 kg)  02/13/24 273 lb 12.8 oz (124.2 kg)    Physical Exam Vitals and nursing note reviewed.  Constitutional:      General: She is not in acute distress.    Appearance: Normal appearance. She is normal weight. She is not ill-appearing, toxic-appearing or diaphoretic.  HENT:     Head: Normocephalic.     Right Ear: External ear normal.     Left Ear: External ear normal.     Nose: Nose normal.     Mouth/Throat:     Mouth: Mucous membranes are moist.     Pharynx: Oropharynx is clear.  Eyes:     General:        Right eye: No discharge.        Left eye: No discharge.     Extraocular Movements: Extraocular movements  intact.     Conjunctiva/sclera: Conjunctivae normal.     Pupils: Pupils are equal, round, and reactive to light.  Cardiovascular:     Rate and Rhythm: Normal rate and regular rhythm.     Heart sounds: No murmur heard. Pulmonary:     Effort: Pulmonary effort is normal. No respiratory distress.     Breath sounds: Normal breath sounds. No wheezing or rales.  Musculoskeletal:     Cervical back: Normal range of motion and neck supple.  Skin:    General: Skin is warm and dry.     Capillary Refill: Capillary refill takes less than 2 seconds.      Neurological:     General: No focal deficit present.     Mental Status: She is alert and oriented to person, place, and time. Mental status is at baseline.  Psychiatric:        Mood and Affect: Mood normal.        Behavior: Behavior normal.        Thought Content: Thought content normal.  Judgment: Judgment normal.     Results for orders placed or performed during the hospital encounter of 02/10/24  Basic metabolic panel   Collection Time: 02/10/24 11:57 AM  Result Value Ref Range   Sodium 138 135 - 145 mmol/L   Potassium 3.9 3.5 - 5.1 mmol/L   Chloride 103 98 - 111 mmol/L   CO2 26 22 - 32 mmol/L   Glucose, Bld 86 70 - 99 mg/dL   BUN 7 6 - 20 mg/dL   Creatinine, Ser 9.40 0.44 - 1.00 mg/dL   Calcium 8.8 (L) 8.9 - 10.3 mg/dL   GFR, Estimated >39 >39 mL/min   Anion gap 9 5 - 15  CBC with Differential   Collection Time: 02/10/24 11:57 AM  Result Value Ref Range   WBC 16.2 (H) 4.0 - 10.5 K/uL   RBC 4.71 3.87 - 5.11 MIL/uL   Hemoglobin 13.9 12.0 - 15.0 g/dL   HCT 58.8 63.9 - 53.9 %   MCV 87.3 80.0 - 100.0 fL   MCH 29.5 26.0 - 34.0 pg   MCHC 33.8 30.0 - 36.0 g/dL   RDW 87.6 88.4 - 84.4 %   Platelets 362 150 - 400 K/uL   nRBC 0.0 0.0 - 0.2 %   Neutrophils Relative % 69 %   Neutro Abs 11.1 (H) 1.7 - 7.7 K/uL   Lymphocytes Relative 20 %   Lymphs Abs 3.2 0.7 - 4.0 K/uL   Monocytes Relative 9 %   Monocytes Absolute 1.4 (H) 0.1 -  1.0 K/uL   Eosinophils Relative 2 %   Eosinophils Absolute 0.3 0.0 - 0.5 K/uL   Basophils Relative 0 %   Basophils Absolute 0.1 0.0 - 0.1 K/uL   Immature Granulocytes 0 %   Abs Immature Granulocytes 0.07 0.00 - 0.07 K/uL      Assessment & Plan:   Problem List Items Addressed This Visit   None Visit Diagnoses       Abscess    -  Primary   No packing needed. Covered with bandaid.  Recommend leaving open to air when at home and covering with bandage when out of the house.         Follow up plan: No follow-ups on file.

## 2024-03-21 ENCOUNTER — Telehealth

## 2024-03-21 DIAGNOSIS — R6889 Other general symptoms and signs: Secondary | ICD-10-CM

## 2024-03-21 DIAGNOSIS — J069 Acute upper respiratory infection, unspecified: Secondary | ICD-10-CM | POA: Diagnosis not present

## 2024-03-21 MED ORDER — BENZONATATE 100 MG PO CAPS: 100.0000 mg | ORAL_CAPSULE | Freq: Three times a day (TID) | ORAL | 0 refills | Status: AC | PRN

## 2024-03-21 MED ORDER — FLUTICASONE PROPIONATE 50 MCG/ACT NA SUSP: 2.0000 | Freq: Every day | NASAL | 0 refills | Status: AC

## 2024-03-21 NOTE — Patient Instructions (Signed)
 " Mary Bradford, thank you for joining Roosvelt Mater, PA-C for today's virtual visit.  While this provider is not your primary care provider (PCP), if your PCP is located in our provider database this encounter information will be shared with them immediately following your visit.   A [facility] MyChart account gives you access to today's visit and all your visits, tests, and labs performed at [facility]  click here if you don't have a [facility] MyChart account or go to [email or web address]  Consent: (Patient) Mary Bradford provided verbal consent for this virtual visit at the beginning of the encounter.  Current Medications:  Current Outpatient Medications:    benzonatate  (TESSALON ) 100 MG capsule, Take 1 capsule (100 mg total) by mouth 3 (three) times daily as needed for up to 7 days., Disp: 21 capsule, Rfl: 0   fluticasone  (FLONASE ) 50 MCG/ACT nasal spray, Place 2 sprays into both nostrils daily., Disp: 16 g, Rfl: 0   chlorhexidine  (HIBICLENS ) 4 % external liquid, Apply topically daily as needed., Disp: 473 mL, Rfl: 2   mupirocin  ointment (BACTROBAN ) 2 %, Apply 1 Application topically 2 (two) times daily., Disp: 30 g, Rfl: 0   oxyCODONE  (ROXICODONE ) 5 MG immediate release tablet, Take 1 tablet (5 mg total) by mouth every 8 (eight) hours as needed for up to 15 doses., Disp: 15 tablet, Rfl: 0   triamcinolone  cream (KENALOG ) 0.1 %, Apply 1 Application topically 2 (two) times daily., Disp: 30 g, Rfl: 4   Medications ordered in this encounter:  Meds ordered this encounter  Medications   benzonatate  (TESSALON ) 100 MG capsule    Sig: Take 1 capsule (100 mg total) by mouth 3 (three) times daily as needed for up to 7 days.    Dispense:  21 capsule    Refill:  0   fluticasone  (FLONASE ) 50 MCG/ACT nasal spray    Sig: Place 2 sprays into both nostrils daily.    Dispense:  16 g    Refill:  0     *If you need refills on other medications prior to your next  appointment, please contact your pharmacy*  Follow-Up: Call back or seek an in-person evaluation if the symptoms worsen or if the condition fails to improve as anticipated.  [facility] [phone]  Other Instructions Upper Respiratory Infection, Adult An upper respiratory infection (URI) is a common viral infection of the nose, throat, and upper air passages that lead to the lungs. The most common type of URI is the common cold. URIs usually get better on their own, without medical treatment. What are the causes? A URI is caused by a virus. You may catch a virus by: Breathing in droplets from an infected person's cough or sneeze. Touching something that has been exposed to the virus (is contaminated) and then touching your mouth, nose, or eyes. What increases the risk? You are more likely to get a URI if: You are very young or very old. You have close contact with others, such as at work, school, or a health care facility. You smoke. You have long-term (chronic) heart or lung disease. You have a weakened disease-fighting system (immune system). You have nasal allergies or asthma. You are experiencing a lot of stress. You have poor nutrition. What are the signs or symptoms? A URI usually involves some of the following symptoms: Runny or stuffy (congested) nose. Cough. Sneezing. Sore throat. Headache. Fatigue. Fever. Loss of appetite. Pain in your forehead, behind your eyes, and over your  cheekbones (sinus pain). Muscle aches. Redness or irritation of the eyes. Pressure in the ears or face. How is this diagnosed? This condition may be diagnosed based on your medical history and symptoms, and a physical exam. Your health care provider may use a swab to take a mucus sample from your nose (nasal swab). This sample can be tested to determine what virus is causing the illness. How is this treated? URIs usually get better on their own within 7-10 days. Medicines  cannot cure URIs, but your health care provider may recommend certain medicines to help relieve symptoms, such as: Over-the-counter cold medicines. Cough suppressants. Coughing is a type of defense against infection that helps to clear the respiratory system, so take these medicines only as recommended by your health care provider. Fever-reducing medicines. Follow these instructions at home: Activity Rest as needed. If you have a fever, stay home from work or school until your fever is gone or until your health care provider says your URI cannot spread to other people (is no longer contagious). Your health care provider may have you wear a face mask to prevent your infection from spreading. Relieving symptoms Gargle with a mixture of salt and water 3-4 times a day or as needed. To make salt water, completely dissolve -1 tsp (3-6 g) of salt in 1 cup (237 mL) of warm water. Use a cool-mist humidifier to add moisture to the air. This can help you breathe more easily. Eating and drinking  Drink enough fluid to keep your urine pale yellow. Eat soups and other clear broths. General instructions  Take over-the-counter and prescription medicines only as told by your health care provider. These include cold medicines, fever reducers, and cough suppressants. Do not use any products that contain nicotine  or tobacco. These products include cigarettes, chewing tobacco, and vaping devices, such as e-cigarettes. If you need help quitting, ask your health care provider. Stay away from secondhand smoke. Stay up to date on all immunizations, including the yearly (annual) flu vaccine. Keep all follow-up visits. This is important. How to prevent the spread of infection to others URIs can be contagious. To prevent the infection from spreading: Wash your hands with soap and water for at least 20 seconds. If soap and water are not available, use hand sanitizer. Avoid touching your mouth, face, eyes, or  nose. Cough or sneeze into a tissue or your sleeve or elbow instead of into your hand or into the air.  Contact a health care provider if: You are getting worse instead of better. You have a fever or chills. Your mucus is brown or red. You have yellow or brown discharge coming from your nose. You have pain in your face, especially when you bend forward. You have swollen neck glands. You have pain while swallowing. You have white areas in the back of your throat. Get help right away if: You have shortness of breath that gets worse. You have severe or persistent: Headache. Ear pain. Sinus pain. Chest pain. You have chronic lung disease along with any of the following: Making high-pitched whistling sounds when you breathe, most often when you breathe out (wheezing). Prolonged cough (more than 14 days). Coughing up blood. A change in your usual mucus. You have a stiff neck. You have changes in your: Vision. Hearing. Thinking. Mood. These symptoms may be an emergency. Get help right away. Call 911. Do not wait to see if the symptoms will go away. Do not drive yourself to the hospital. Summary An upper  respiratory infection (URI) is a common infection of the nose, throat, and upper air passages that lead to the lungs. A URI is caused by a virus. URIs usually get better on their own within 7-10 days. Medicines cannot cure URIs, but your health care provider may recommend certain medicines to help relieve symptoms. This information is not intended to replace advice given to you by your health care provider. Make sure you discuss any questions you have with your health care provider. Document Revised: 10/13/2020 Document Reviewed: 10/13/2020 Elsevier Patient Education  2024 [employer]   If you have been instructed to have an in-person evaluation today at a local Urgent Care facility, please use the link below. It will take you to a list of all of our available [facility], including address, phone number and hours of operation. Please do not delay care.  [facility]  If you or a family member do not have a primary care provider, use the link below to schedule a visit and establish care. When you choose a [facility] primary care physician or advanced practice provider, you gain a long-term partner in health. Find a Primary Care Provider  Learn more about [facility]'s in-office and virtual care options: [facility] - Get Care Now  "

## 2024-03-21 NOTE — Progress Notes (Signed)
 " Virtual Visit Consent   Mary Bradford, you are scheduled for a virtual visit with a [facility] provider today. Just as with appointments in the office, your consent must be obtained to participate. Your consent will be active for this visit and any virtual visit you may have with one of our providers in the next 365 days. If you have a MyChart account, a copy of this consent can be sent to you electronically.  As this is a virtual visit, video technology does not allow for your provider to perform a traditional examination. This may limit your provider's ability to fully assess your condition. If your provider identifies any concerns that need to be evaluated in person or the need to arrange testing (such as labs, EKG, etc.), we will make arrangements to do so. Although advances in technology are sophisticated, we cannot ensure that it will always work on either your end or our end. If the connection with a video visit is poor, the visit may have to be switched to a telephone visit. With either a video or telephone visit, we are not always able to ensure that we have a secure connection.  By engaging in this virtual visit, you consent to the provision of healthcare and authorize for your insurance to be billed (if applicable) for the services provided during this visit. Depending on your insurance coverage, you may receive a charge related to this service.  I need to obtain your verbal consent now. Are you willing to proceed with your visit today? Mary Bradford has provided verbal consent on 03/21/2024 for a virtual visit (video or telephone). Roosvelt Mater, [place]  Date: 03/21/2024 7:54 AM   Virtual Visit via Video Note   I, Roosvelt Mater, connected with  Mary Bradford  ([id number], March 13, 1986) on 03/21/2024 at  7:45 AM EST by a video-enabled telemedicine application and verified that I am speaking with the correct person using two identifiers.  Location: Patient: Virtual Visit  Location Patient: Home Provider: Virtual Visit Location Provider: Home Office   I discussed the limitations of evaluation and management by telemedicine and the availability of in person appointments. The patient expressed understanding and agreed to proceed.    History of Present Illness: Mary Bradford is a 38 y.o. who identifies as a female who was assigned female at birth, and is being seen today for c/o she thinks she might have the Flu.  Pt states she has nasal congestion, dry cough with occasional phlegm, fever and chills.  Pt states started to have nausea and vomiting.  Pt states she has a headache as well.  Pt states symptoms started on Tuesday. Pt states taking Dayquil and cough drops.   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  Resting comfortably at home.  Head is normocephalic, atraumatic.  No labored breathing.  Speech is clear and coherent with logical content.  Patient is alert and oriented at baseline.    Assessment and Plan: 1. Flu-like symptoms (Primary) - benzonatate  (  TESSALON ) 100 MG capsule; Take 1 capsule (100 mg total) by mouth 3 (three) times daily as needed for up to 7 days.  Dispense: 21 capsule; Refill: 0 - fluticasone  (FLONASE ) 50 MCG/ACT nasal spray; Place 2 sprays into both nostrils daily.  Dispense: 16 g; Refill: 0  2. Upper respiratory tract infection, unspecified type - benzonatate   (TESSALON ) 100 MG capsule; Take 1 capsule (100 mg total) by mouth 3 (three) times daily as needed for up to 7 days.  Dispense: 21 capsule; Refill: 0 - fluticasone  (FLONASE ) 50 MCG/ACT nasal spray; Place 2 sprays into both nostrils daily.  Dispense: 16 g; Refill: 0  -Discussed with Pt needing to test to verify if she has either Flu or covid, however, she is already outside the treatment window.   -Continue conservative treatments  -Pt advised if worsening symptoms to follow up with in person urgent care or PCP  Follow Up Instructions: I discussed the assessment and treatment plan with the patient. The patient was provided an opportunity to ask questions and all were answered. The patient agreed with the plan and demonstrated an understanding of the instructions.  A copy of instructions were sent to the patient via MyChart unless otherwise noted below.    The patient was advised to call back or seek an in-person evaluation if the symptoms worsen or if the condition fails to improve as anticipated.    Roosvelt Mater, PA-C    [1]  Allergies Allergen Reactions   Amoxicillin Other (See Comments)    Has patient had a PCN reaction causing immediate rash, facial/tongue/throat swelling, SOB or lightheadedness with hypotension: Unknown Has patient had a PCN reaction causing severe rash involving mucus membranes or skin necrosis: Unknown Has patient had a PCN reaction that required hospitalization: Unknown Has patient had a PCN reaction occurring within the last 10 years: No If all of the above answers are NO, then may proceed with Cephalosporin use.   [2]  Current Outpatient Medications:    benzonatate  (TESSALON ) 100 MG capsule, Take 1 capsule (100 mg total) by mouth 3 (three) times daily as needed for up to 7 days., Disp: 21 capsule, Rfl: 0   fluticasone  (FLONASE ) 50 MCG/ACT nasal spray, Place 2 sprays into both nostrils daily., Disp: 16 g, Rfl: 0   chlorhexidine  (HIBICLENS ) 4 % external liquid,  Apply topically daily as needed., Disp: 473 mL, Rfl: 2   mupirocin  ointment (BACTROBAN ) 2 %, Apply 1 Application topically 2 (two) times daily., Disp: 30 g, Rfl: 0   oxyCODONE  (ROXICODONE ) 5 MG immediate release tablet, Take 1 tablet (5 mg total) by mouth every 8 (eight) hours as needed for up to 15 doses., Disp: 15 tablet, Rfl: 0   triamcinolone  cream (KENALOG ) 0.1 %, Apply 1 Application topically 2 (two) times daily., Disp: 30 g, Rfl: 4  "

## 2024-05-20 ENCOUNTER — Other Ambulatory Visit

## 2024-05-20 ENCOUNTER — Ambulatory Visit: Admitting: Oncology

## 2025-01-05 ENCOUNTER — Encounter: Admitting: Nurse Practitioner
